# Patient Record
Sex: Male | Born: 1935 | Race: White | Hispanic: No | Smoking: Former smoker
Health system: Southern US, Community
[De-identification: ages and names within clinical notes are randomized; demographics above are authoritative.]

## PROBLEM LIST (undated history)

## (undated) DIAGNOSIS — C7A012 Malignant carcinoid tumor of the ileum: Secondary | ICD-10-CM

## (undated) DIAGNOSIS — K5090 Crohn's disease, unspecified, without complications: Secondary | ICD-10-CM

## (undated) DIAGNOSIS — F1011 Alcohol abuse, in remission: Secondary | ICD-10-CM

## (undated) DIAGNOSIS — K219 Gastro-esophageal reflux disease without esophagitis: Secondary | ICD-10-CM

## (undated) DIAGNOSIS — K222 Esophageal obstruction: Secondary | ICD-10-CM

## (undated) DIAGNOSIS — N200 Calculus of kidney: Secondary | ICD-10-CM

## (undated) DIAGNOSIS — C61 Malignant neoplasm of prostate: Secondary | ICD-10-CM

## (undated) HISTORY — DX: Malignant neoplasm of prostate: C61

## (undated) HISTORY — PX: PROSTATECTOMY: SHX69

## (undated) HISTORY — PX: APPENDECTOMY: SHX54

## (undated) HISTORY — DX: Alcohol abuse, in remission: F10.11

## (undated) HISTORY — DX: Calculus of kidney: N20.0

## (undated) HISTORY — DX: Crohn's disease, unspecified, without complications: K50.90

## (undated) HISTORY — DX: Esophageal obstruction: K22.2

## (undated) HISTORY — DX: Malignant carcinoid tumor of the ileum: C7A.012

## (undated) HISTORY — PX: LITHOTRIPSY: SUR834

## (undated) HISTORY — DX: Gastro-esophageal reflux disease without esophagitis: K21.9

## (undated) HISTORY — PX: TONSILLECTOMY: SUR1361

---

## 1999-04-18 ENCOUNTER — Ambulatory Visit (HOSPITAL_COMMUNITY): Admission: RE | Admit: 1999-04-18 | Discharge: 1999-04-18 | Payer: Self-pay | Admitting: Cardiology

## 1999-04-18 ENCOUNTER — Encounter: Payer: Self-pay | Admitting: Internal Medicine

## 2001-10-13 ENCOUNTER — Ambulatory Visit (HOSPITAL_COMMUNITY): Admission: RE | Admit: 2001-10-13 | Discharge: 2001-10-13 | Payer: Self-pay | Admitting: Gastroenterology

## 2001-10-13 ENCOUNTER — Encounter: Payer: Self-pay | Admitting: Gastroenterology

## 2001-10-24 ENCOUNTER — Encounter: Payer: Self-pay | Admitting: Internal Medicine

## 2001-12-15 ENCOUNTER — Ambulatory Visit (HOSPITAL_COMMUNITY): Admission: RE | Admit: 2001-12-15 | Discharge: 2001-12-15 | Payer: Self-pay | Admitting: Internal Medicine

## 2001-12-15 ENCOUNTER — Encounter: Payer: Self-pay | Admitting: Internal Medicine

## 2002-01-05 ENCOUNTER — Ambulatory Visit (HOSPITAL_COMMUNITY): Admission: RE | Admit: 2002-01-05 | Discharge: 2002-01-05 | Payer: Self-pay | Admitting: Internal Medicine

## 2002-01-16 ENCOUNTER — Inpatient Hospital Stay (HOSPITAL_COMMUNITY): Admission: EM | Admit: 2002-01-16 | Discharge: 2002-01-19 | Payer: Self-pay | Admitting: Emergency Medicine

## 2002-01-16 ENCOUNTER — Encounter: Payer: Self-pay | Admitting: Emergency Medicine

## 2002-01-18 ENCOUNTER — Encounter: Payer: Self-pay | Admitting: Surgery

## 2002-05-12 ENCOUNTER — Encounter: Payer: Self-pay | Admitting: Surgery

## 2002-05-19 ENCOUNTER — Inpatient Hospital Stay (HOSPITAL_COMMUNITY): Admission: RE | Admit: 2002-05-19 | Discharge: 2002-05-24 | Payer: Self-pay | Admitting: Surgery

## 2002-05-19 ENCOUNTER — Encounter (INDEPENDENT_AMBULATORY_CARE_PROVIDER_SITE_OTHER): Payer: Self-pay | Admitting: Specialist

## 2002-05-19 HISTORY — PX: SMALL INTESTINE SURGERY: SHX150

## 2002-05-22 ENCOUNTER — Encounter: Payer: Self-pay | Admitting: Surgery

## 2002-08-07 ENCOUNTER — Encounter (HOSPITAL_COMMUNITY): Payer: Self-pay | Admitting: Oncology

## 2002-08-07 ENCOUNTER — Ambulatory Visit (HOSPITAL_COMMUNITY): Admission: RE | Admit: 2002-08-07 | Discharge: 2002-08-07 | Payer: Self-pay | Admitting: Oncology

## 2002-09-07 ENCOUNTER — Encounter (HOSPITAL_COMMUNITY): Payer: Self-pay | Admitting: Oncology

## 2002-09-07 ENCOUNTER — Ambulatory Visit (HOSPITAL_COMMUNITY): Admission: RE | Admit: 2002-09-07 | Discharge: 2002-09-07 | Payer: Self-pay | Admitting: Oncology

## 2003-01-29 ENCOUNTER — Encounter (HOSPITAL_COMMUNITY): Payer: Self-pay | Admitting: Oncology

## 2003-01-29 ENCOUNTER — Ambulatory Visit (HOSPITAL_COMMUNITY): Admission: RE | Admit: 2003-01-29 | Discharge: 2003-01-29 | Payer: Self-pay | Admitting: Oncology

## 2003-03-29 ENCOUNTER — Encounter (HOSPITAL_COMMUNITY): Payer: Self-pay | Admitting: Oncology

## 2003-03-29 ENCOUNTER — Ambulatory Visit (HOSPITAL_COMMUNITY): Admission: RE | Admit: 2003-03-29 | Discharge: 2003-03-29 | Payer: Self-pay | Admitting: Oncology

## 2004-06-07 ENCOUNTER — Ambulatory Visit: Payer: Self-pay | Admitting: Oncology

## 2004-07-24 IMAGING — CT CT ABDOMEN W/ CM
1 of 3 series · 14 of 32 positions shown, 19 images · IV contrast (omnipaque)
Comparison: none

FINDINGS
CLINICAL DATA: 69 YEAR OLD WITH CARCINOID TUMOR.
CT ABDOMEN AND PELVIS WITH CONTRAST
HELICAL IMAGES ARE PERFORMED THROUGH THE ABDOMEN AND PELVIS FOLLOWING ADMINISTRATION OF ORAL
CONTRAST AND DURING ADMINISTRATION OF 150 CC OMNIPAQUE 300.  IMAGES OF THE LUNG BASES ARE
UNREMARKABLE.  NO FOCAL ABNORMALITY IS SEEN WITHIN THE LIVER, SPLEEN, PANCREAS, OR ADRENAL GLANDS.
THERE ARE NON ENLARGED PERIPORTAL LYMPH NODES STABLE IN APPEARANCE.  SMALL LOW ATTENUATION LESIONS
WITHIN THE KIDNEYS ARE CONSISTENT WITH CYSTS THAT ARE UNCHANGED.  GALLBLADDER IS PRESENT.
DEGENERATIVE CHANGES ARE NOTED IN THE SPINE.
IMPRESSION
NO EVIDENCE FOR METASTATIC DISEASE OF THE ABDOMEN.
CT OF THE PELVIS WITH CONTRAST
PROSTATE IS ENLARGED.  THERE IS NO PELVIC ADENOPATHY OR FREE PELVIC FLUID.  PELVIC BOWEL LOOPS HAVE
A NORMAL APPEARANCE.
NO EVIDENCE FOR RECURRENT DISEASE OF THE PELVIS.

[Series 2: — · axial · 0.69mm/px · z∈[+717,+1075]mm · 14 of 80 slices shown, 19 images]
[im 4/80  soft-tissue]
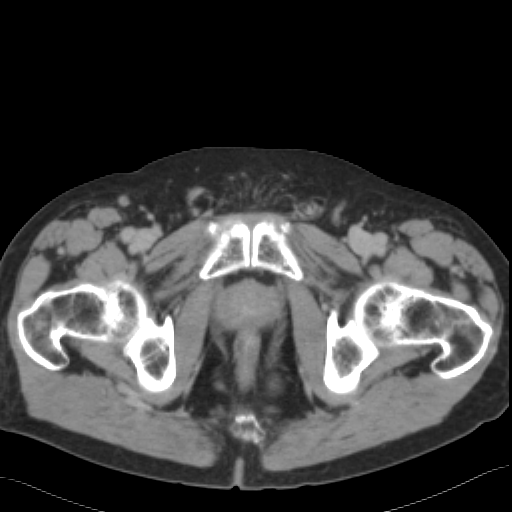
[im 4/80  bone]
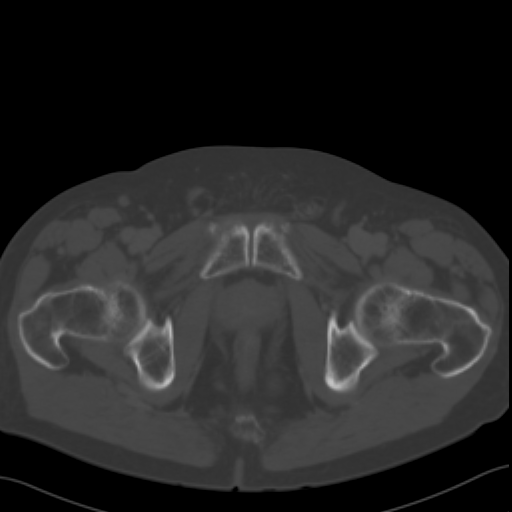
[im 12/80  soft-tissue]
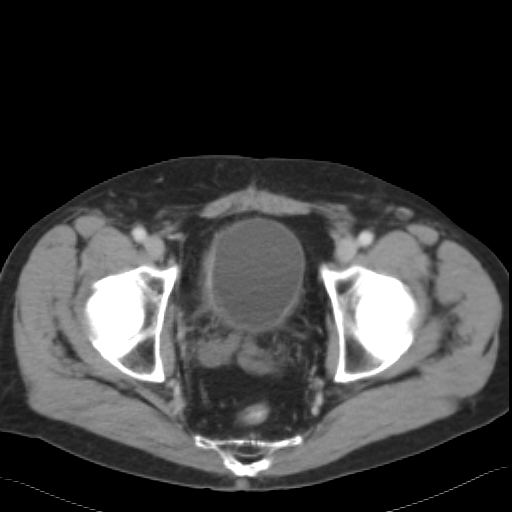
[im 16/80  soft-tissue]
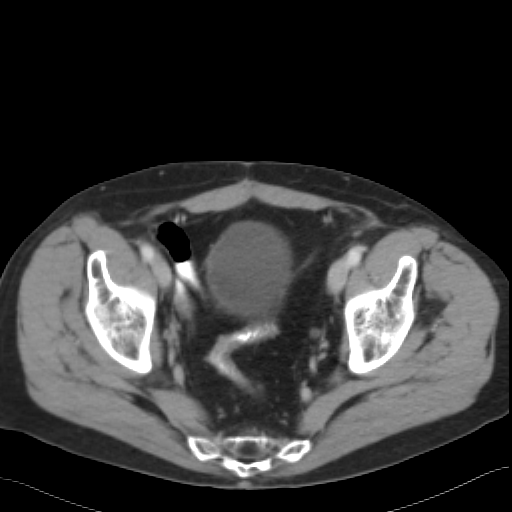
[im 24/80  soft-tissue]
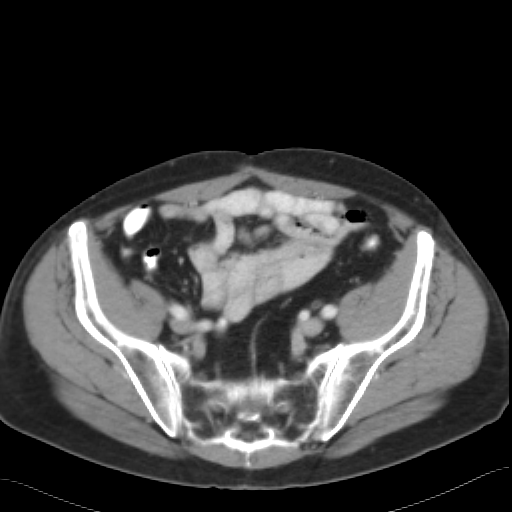
[im 28/80  soft-tissue]
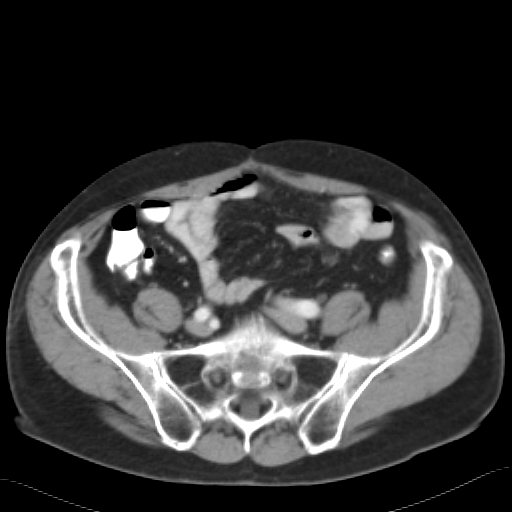
[im 36/80  soft-tissue]
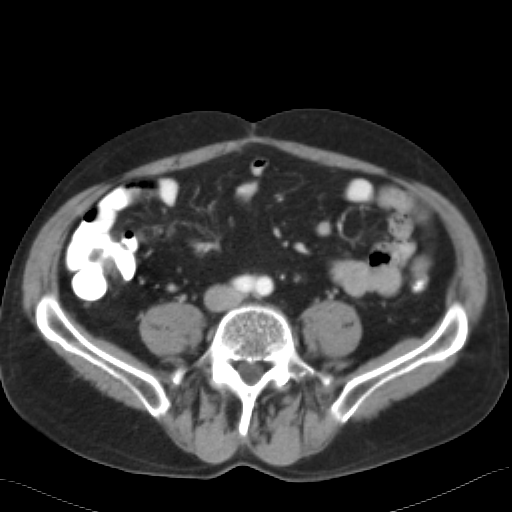
[im 40/80  soft-tissue]
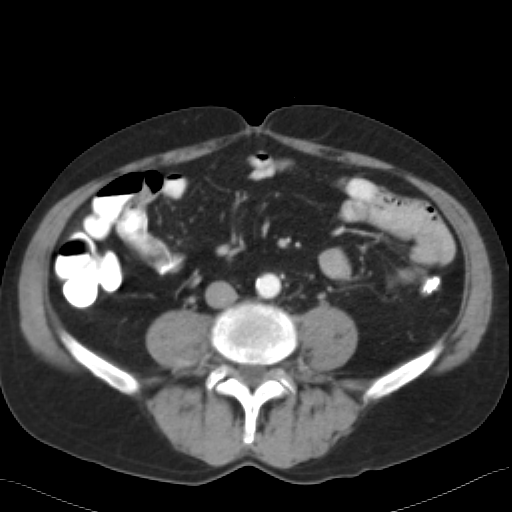
[im 44/80  soft-tissue]
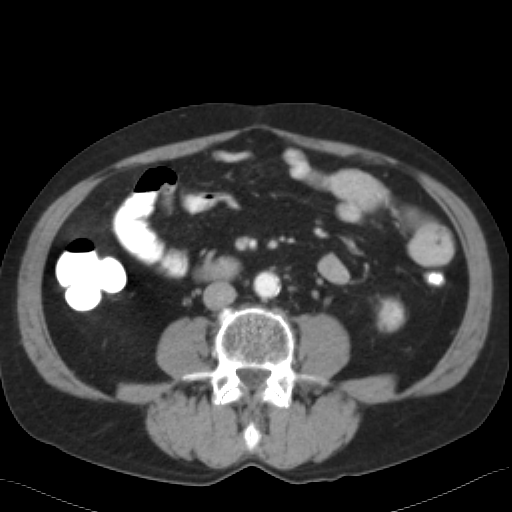
[im 52/80  soft-tissue]
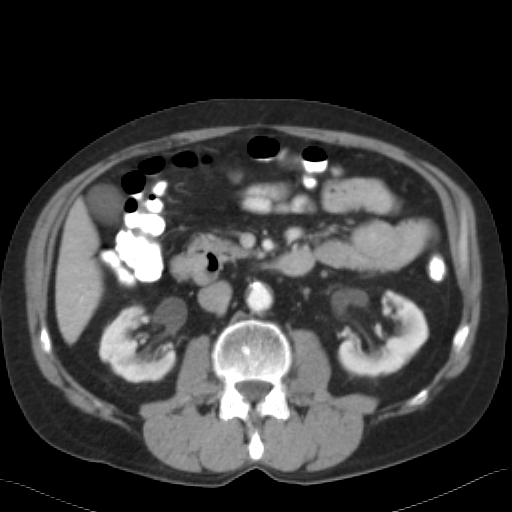
[im 52/80  bone]
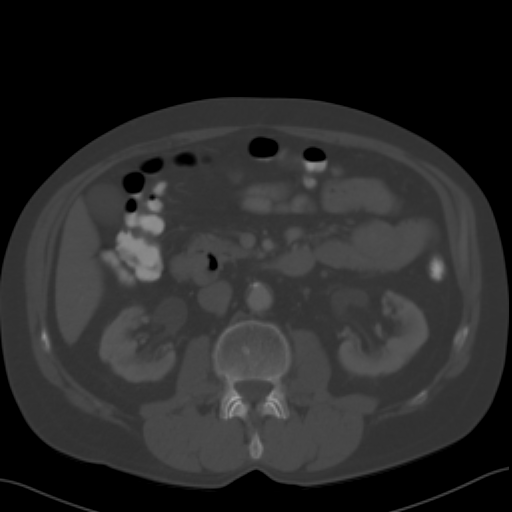
[im 56/80  soft-tissue]
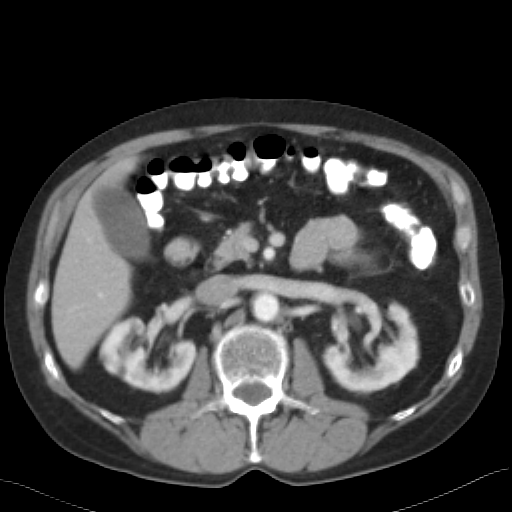
[im 64/80  soft-tissue]
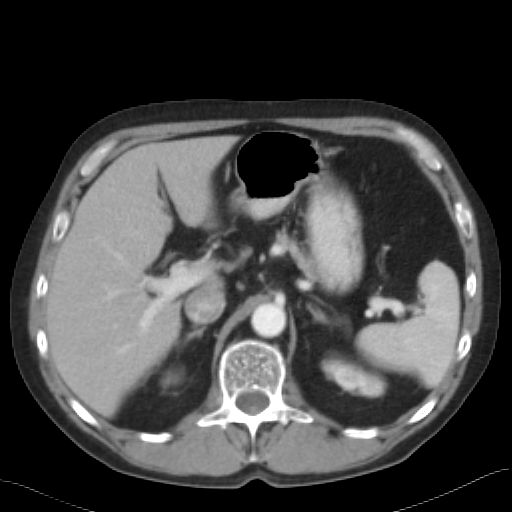
[im 64/80  lung]
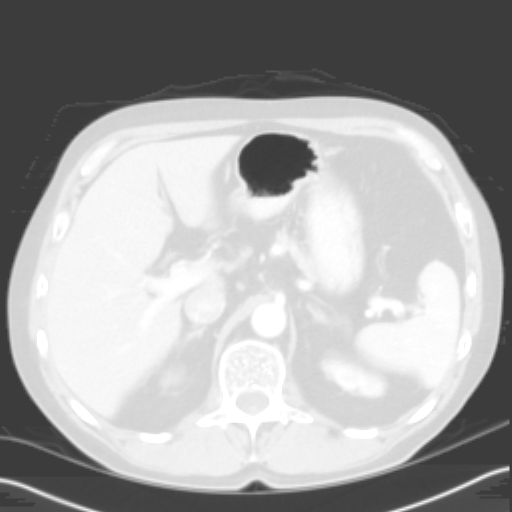
[im 68/80  soft-tissue]
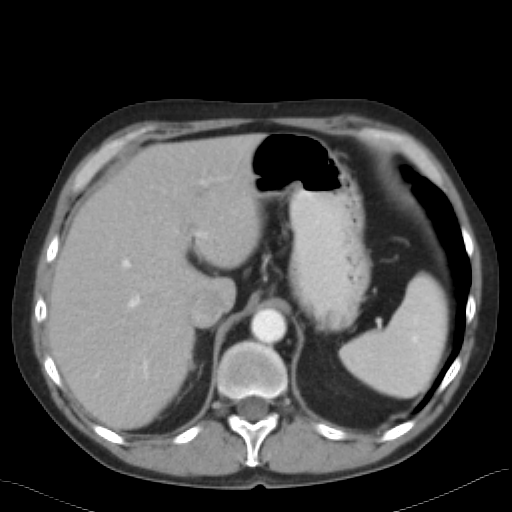
[im 68/80  lung]
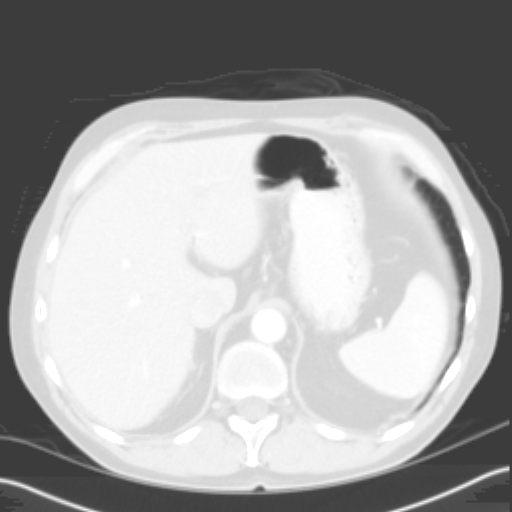
[im 72/80  lung]
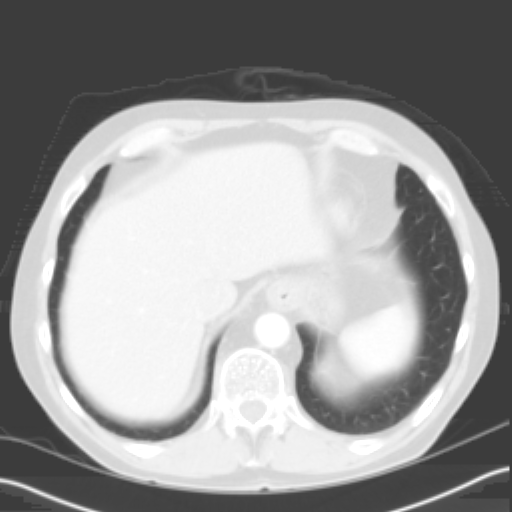
[im 76/80  soft-tissue]
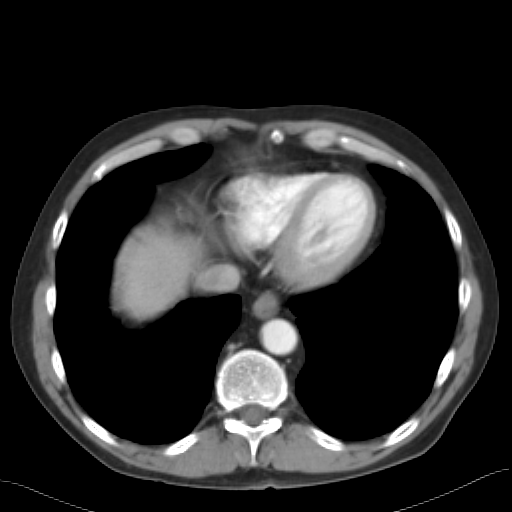
[im 76/80  lung]
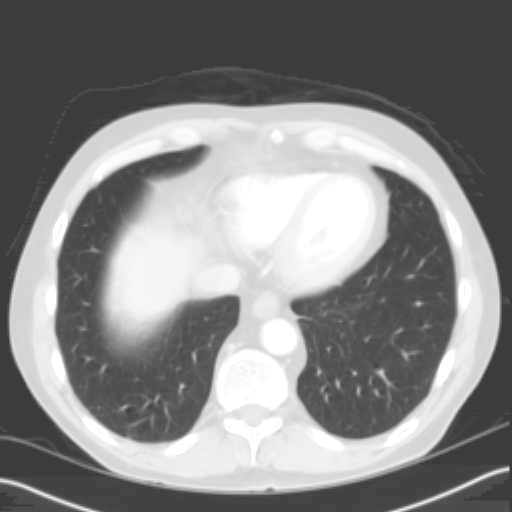

[14 of 32 positions shown; findings below may reference images not displayed]

## 2004-10-13 ENCOUNTER — Ambulatory Visit: Payer: Self-pay | Admitting: Oncology

## 2005-03-14 ENCOUNTER — Ambulatory Visit: Payer: Self-pay | Admitting: Internal Medicine

## 2005-04-13 ENCOUNTER — Ambulatory Visit (HOSPITAL_COMMUNITY): Admission: RE | Admit: 2005-04-13 | Discharge: 2005-04-13 | Payer: Self-pay | Admitting: Oncology

## 2005-04-19 ENCOUNTER — Ambulatory Visit: Payer: Self-pay | Admitting: Oncology

## 2005-10-17 ENCOUNTER — Ambulatory Visit: Payer: Self-pay | Admitting: Oncology

## 2005-10-19 LAB — CBC WITH DIFFERENTIAL/PLATELET
Basophils Absolute: 0 10*3/uL (ref 0.0–0.1)
EOS%: 1.2 % (ref 0.0–7.0)
Eosinophils Absolute: 0.1 10*3/uL (ref 0.0–0.5)
HCT: 36.2 % — ABNORMAL LOW (ref 38.7–49.9)
HGB: 12.7 g/dL — ABNORMAL LOW (ref 13.0–17.1)
MCH: 35 pg — ABNORMAL HIGH (ref 28.0–33.4)
MCV: 99.5 fL — ABNORMAL HIGH (ref 81.6–98.0)
MONO%: 5.5 % (ref 0.0–13.0)
NEUT#: 3.2 10*3/uL (ref 1.5–6.5)
NEUT%: 62.7 % (ref 40.0–75.0)
Platelets: 206 10*3/uL (ref 145–400)
RDW: 12.7 % (ref 11.2–14.6)

## 2005-10-19 LAB — COMPREHENSIVE METABOLIC PANEL
AST: 20 U/L (ref 0–37)
Albumin: 4.5 g/dL (ref 3.5–5.2)
Alkaline Phosphatase: 45 U/L (ref 39–117)
BUN: 26 mg/dL — ABNORMAL HIGH (ref 6–23)
Calcium: 9 mg/dL (ref 8.4–10.5)
Creatinine, Ser: 2.1 mg/dL — ABNORMAL HIGH (ref 0.4–1.5)
Glucose, Bld: 85 mg/dL (ref 70–99)
Potassium: 4.1 mEq/L (ref 3.5–5.3)

## 2005-10-19 LAB — PSA: PSA: 0.03 ng/mL — ABNORMAL LOW (ref 0.10–4.00)

## 2005-10-23 LAB — CREATININE CLEARANCE, URINE, 24 HOUR
Creatinine: 2.1 mg/dL — ABNORMAL HIGH (ref 0.4–1.5)
Urine Total Volume-CRCL: 950 mL

## 2005-10-26 LAB — 5 HIAA, QUANTITATIVE, URINE, 24 HOUR
5-HIAA, Ur: 3.7 mg/L
Creatinine, Urine mg/day: 959 mg/d (ref 800–2100)
Creatinine, Urine-mg/dL-5HIAA: 101 mg/dL
Time-5HIAA: 24 hr
Volume, Urine-5HIAA: 950 mL

## 2006-04-16 ENCOUNTER — Ambulatory Visit: Payer: Self-pay | Admitting: Oncology

## 2006-10-15 ENCOUNTER — Ambulatory Visit: Payer: Self-pay | Admitting: Oncology

## 2006-10-18 LAB — COMPREHENSIVE METABOLIC PANEL
ALT: 13 U/L (ref 0–53)
AST: 13 U/L (ref 0–37)
Albumin: 4.3 g/dL (ref 3.5–5.2)
CO2: 24 mEq/L (ref 19–32)
Calcium: 9.3 mg/dL (ref 8.4–10.5)
Chloride: 103 mEq/L (ref 96–112)
Creatinine, Ser: 1.5 mg/dL (ref 0.40–1.50)
Potassium: 4.3 mEq/L (ref 3.5–5.3)
Sodium: 139 mEq/L (ref 135–145)
Total Protein: 7 g/dL (ref 6.0–8.3)

## 2006-10-18 LAB — CBC WITH DIFFERENTIAL/PLATELET
BASO%: 1.4 % (ref 0.0–2.0)
EOS%: 0.9 % (ref 0.0–7.0)
HCT: 35.7 % — ABNORMAL LOW (ref 38.7–49.9)
MCH: 35.4 pg — ABNORMAL HIGH (ref 28.0–33.4)
MCHC: 36.3 g/dL — ABNORMAL HIGH (ref 32.0–35.9)
MONO#: 0.3 10*3/uL (ref 0.1–0.9)
NEUT%: 65.4 % (ref 40.0–75.0)
RDW: 11.3 % (ref 11.2–14.6)
WBC: 5.2 10*3/uL (ref 4.0–10.0)
lymph#: 1.4 10*3/uL (ref 0.9–3.3)

## 2006-10-18 LAB — LACTATE DEHYDROGENASE: LDH: 115 U/L (ref 94–250)

## 2006-10-21 ENCOUNTER — Ambulatory Visit (HOSPITAL_COMMUNITY): Admission: RE | Admit: 2006-10-21 | Discharge: 2006-10-21 | Payer: Self-pay | Admitting: Oncology

## 2006-12-13 ENCOUNTER — Ambulatory Visit: Payer: Self-pay | Admitting: Internal Medicine

## 2007-04-15 ENCOUNTER — Ambulatory Visit: Payer: Self-pay | Admitting: Oncology

## 2007-04-18 LAB — CBC WITH DIFFERENTIAL/PLATELET
EOS%: 0.4 % (ref 0.0–7.0)
Eosinophils Absolute: 0 10*3/uL (ref 0.0–0.5)
LYMPH%: 20.2 % (ref 14.0–48.0)
MCH: 35.5 pg — ABNORMAL HIGH (ref 28.0–33.4)
MCV: 99.1 fL — ABNORMAL HIGH (ref 81.6–98.0)
MONO%: 4.9 % (ref 0.0–13.0)
NEUT#: 3.2 10*3/uL (ref 1.5–6.5)
Platelets: 197 10*3/uL (ref 145–400)
RBC: 3.45 10*6/uL — ABNORMAL LOW (ref 4.20–5.71)
RDW: 12.7 % (ref 11.2–14.6)

## 2007-04-18 LAB — COMPREHENSIVE METABOLIC PANEL
ALT: 17 U/L (ref 0–53)
Alkaline Phosphatase: 41 U/L (ref 39–117)
CO2: 25 mEq/L (ref 19–32)
Sodium: 142 mEq/L (ref 135–145)
Total Bilirubin: 0.6 mg/dL (ref 0.3–1.2)
Total Protein: 6.7 g/dL (ref 6.0–8.3)

## 2007-10-16 ENCOUNTER — Ambulatory Visit: Payer: Self-pay | Admitting: Oncology

## 2007-10-21 LAB — CBC WITH DIFFERENTIAL/PLATELET
BASO%: 1 % (ref 0.0–2.0)
EOS%: 2 % (ref 0.0–7.0)
HCT: 35.8 % — ABNORMAL LOW (ref 38.7–49.9)
MCH: 35.8 pg — ABNORMAL HIGH (ref 28.0–33.4)
MCHC: 36.2 g/dL — ABNORMAL HIGH (ref 32.0–35.9)
MONO#: 0.2 10*3/uL (ref 0.1–0.9)
RBC: 3.62 10*6/uL — ABNORMAL LOW (ref 4.20–5.71)
RDW: 12.6 % (ref 11.2–14.6)
WBC: 4.8 10*3/uL (ref 4.0–10.0)
lymph#: 1 10*3/uL (ref 0.9–3.3)

## 2007-10-21 LAB — COMPREHENSIVE METABOLIC PANEL
ALT: 20 U/L (ref 0–53)
AST: 17 U/L (ref 0–37)
CO2: 24 mEq/L (ref 19–32)
Calcium: 9.3 mg/dL (ref 8.4–10.5)
Chloride: 104 mEq/L (ref 96–112)
Potassium: 4.8 mEq/L (ref 3.5–5.3)
Sodium: 140 mEq/L (ref 135–145)
Total Protein: 6.9 g/dL (ref 6.0–8.3)

## 2007-10-21 LAB — LACTATE DEHYDROGENASE: LDH: 129 U/L (ref 94–250)

## 2007-11-17 ENCOUNTER — Ambulatory Visit: Payer: Self-pay | Admitting: Internal Medicine

## 2007-11-25 ENCOUNTER — Encounter: Payer: Self-pay | Admitting: Internal Medicine

## 2007-11-27 ENCOUNTER — Encounter: Payer: Self-pay | Admitting: Internal Medicine

## 2007-11-27 ENCOUNTER — Ambulatory Visit: Payer: Self-pay | Admitting: Internal Medicine

## 2007-11-28 ENCOUNTER — Encounter: Payer: Self-pay | Admitting: Internal Medicine

## 2007-12-15 ENCOUNTER — Telehealth: Payer: Self-pay | Admitting: Internal Medicine

## 2007-12-22 ENCOUNTER — Encounter: Payer: Self-pay | Admitting: Internal Medicine

## 2008-04-15 ENCOUNTER — Ambulatory Visit: Payer: Self-pay | Admitting: Oncology

## 2008-04-15 IMAGING — CR DG CHEST 2V
2 series · 2 of 2 positions shown · non-contrast
Comparison: 04/13/05.

CLINICAL DATA: Sore throat with chest congestion.  Follow-up, history of prostate cancer and colon cancer. 
 CHEST - 2 VIEW:

[w chest pa]
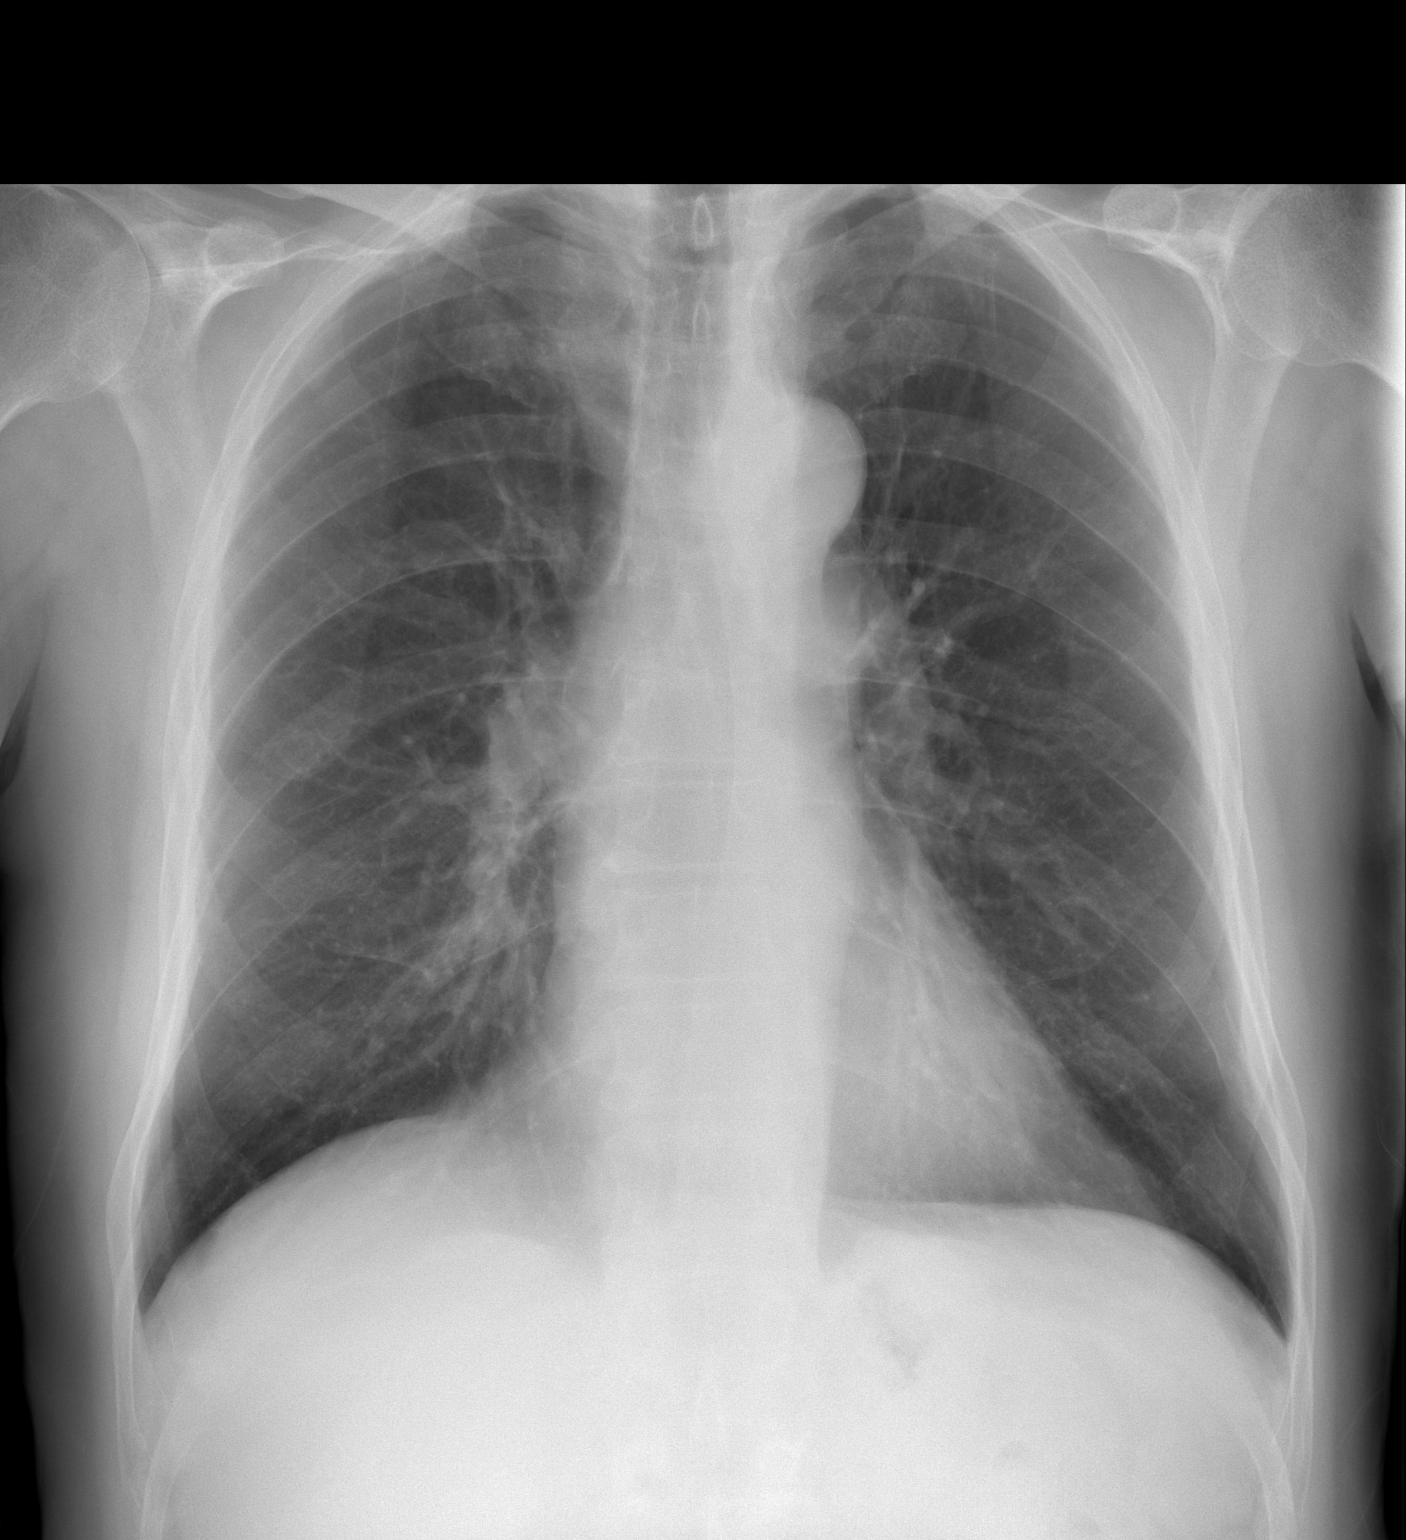

[w chest lat]
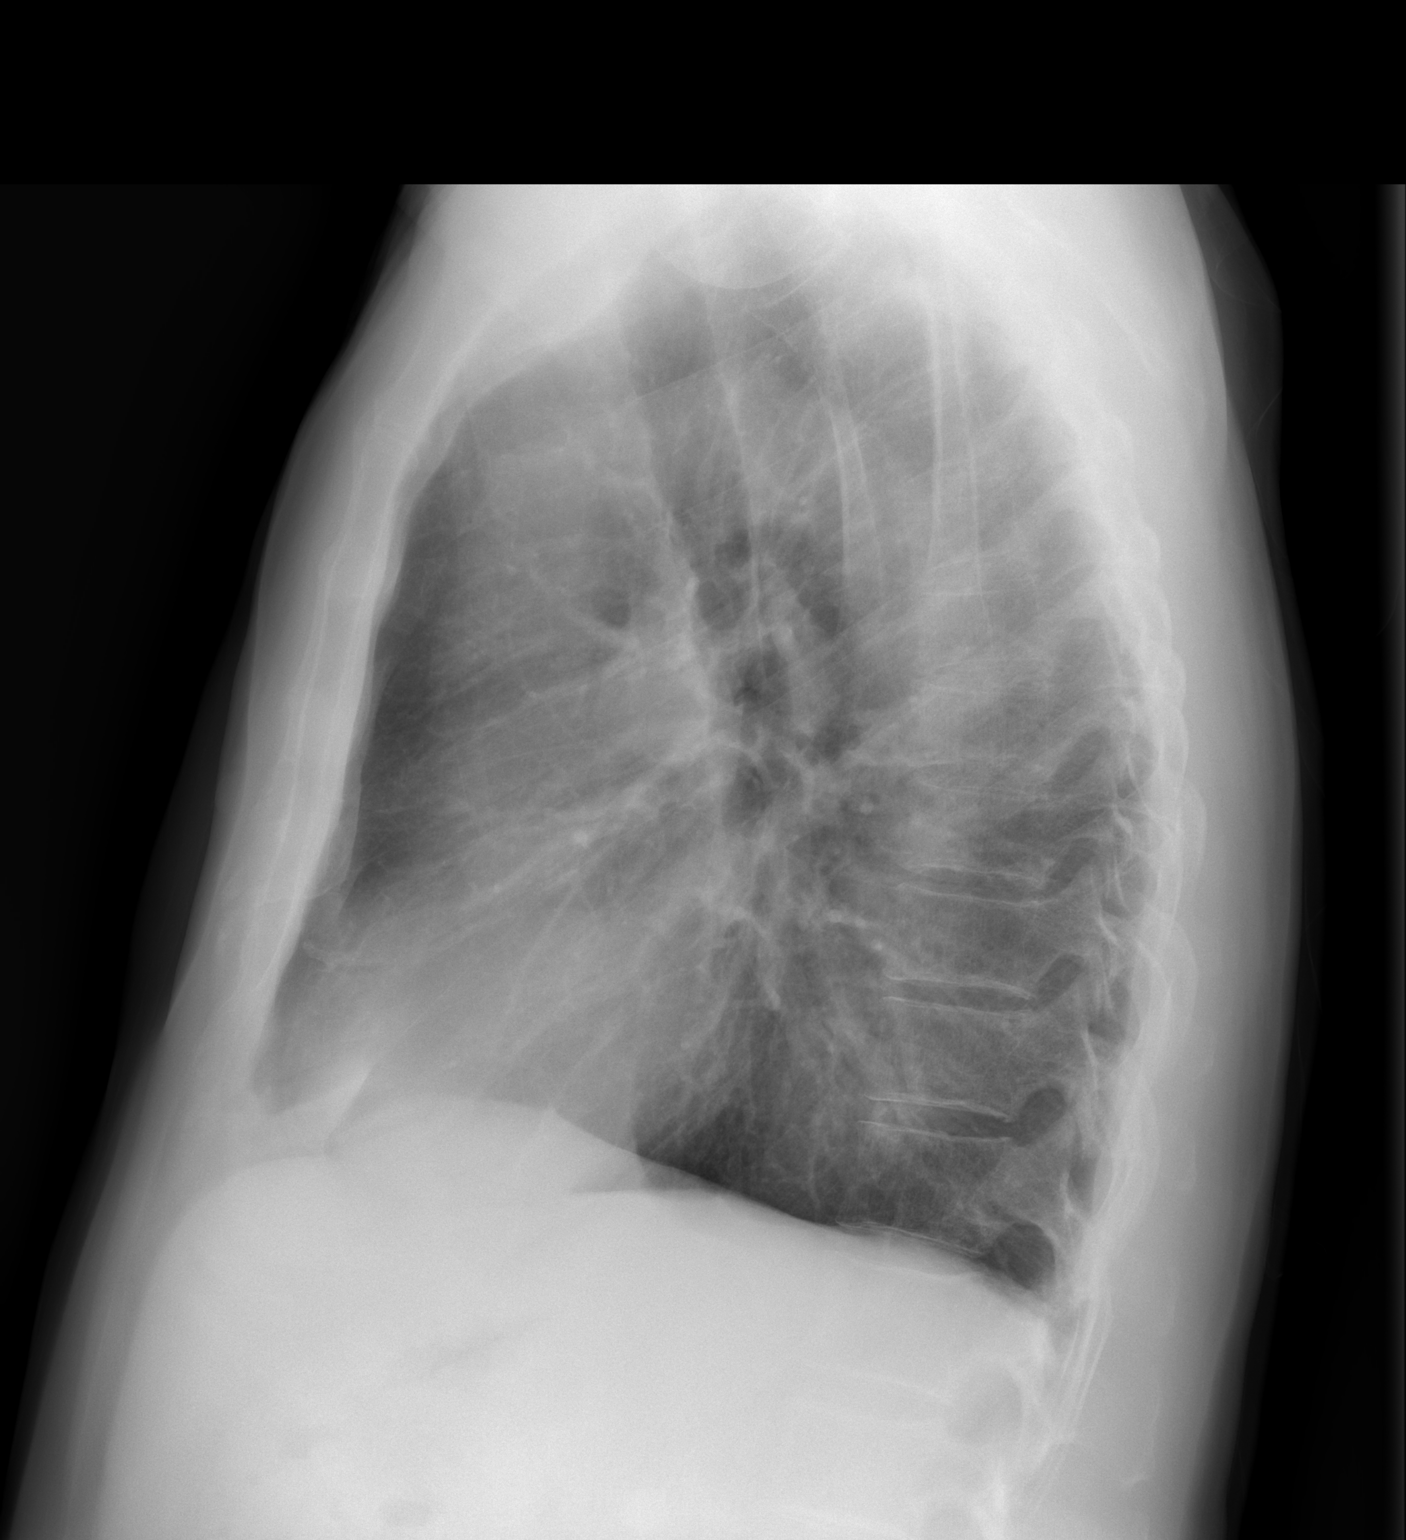

[2 of 2 positions shown; findings below may reference images not displayed]

FINDINGS: The trachea is midline.  The heart size is normal.   The transverse aorta is mildly prominent, stable.   The mediastinal contours are otherwise normal.  The costophrenic angles are sharp.  No pneumothorax.   The lungs are clear.
IMPRESSION: No acute cardiopulmonary disease.

## 2008-04-19 ENCOUNTER — Encounter: Payer: Self-pay | Admitting: Internal Medicine

## 2008-04-19 ENCOUNTER — Ambulatory Visit (HOSPITAL_COMMUNITY): Admission: RE | Admit: 2008-04-19 | Discharge: 2008-04-19 | Payer: Self-pay | Admitting: Oncology

## 2008-04-19 LAB — CBC WITH DIFFERENTIAL/PLATELET
BASO%: 0.7 % (ref 0.0–2.0)
EOS%: 1.3 % (ref 0.0–7.0)
HCT: 39.2 % (ref 38.7–49.9)
MCH: 35.5 pg — ABNORMAL HIGH (ref 28.0–33.4)
MCHC: 34.7 g/dL (ref 32.0–35.9)
NEUT%: 73.2 % (ref 40.0–75.0)
RDW: 12.4 % (ref 11.2–14.6)
lymph#: 1.2 10*3/uL (ref 0.9–3.3)

## 2008-04-19 LAB — PSA: PSA: 0.21 ng/mL (ref 0.10–4.00)

## 2008-04-19 LAB — COMPREHENSIVE METABOLIC PANEL
ALT: 50 U/L (ref 0–53)
AST: 33 U/L (ref 0–37)
Alkaline Phosphatase: 55 U/L (ref 39–117)
Calcium: 9.3 mg/dL (ref 8.4–10.5)
Chloride: 105 mEq/L (ref 96–112)
Creatinine, Ser: 1.54 mg/dL — ABNORMAL HIGH (ref 0.40–1.50)

## 2008-04-19 LAB — LACTATE DEHYDROGENASE: LDH: 145 U/L (ref 94–250)

## 2008-08-17 ENCOUNTER — Telehealth: Payer: Self-pay | Admitting: Internal Medicine

## 2008-08-25 DIAGNOSIS — Z8719 Personal history of other diseases of the digestive system: Secondary | ICD-10-CM | POA: Insufficient documentation

## 2008-08-25 DIAGNOSIS — K219 Gastro-esophageal reflux disease without esophagitis: Secondary | ICD-10-CM

## 2008-08-25 DIAGNOSIS — K222 Esophageal obstruction: Secondary | ICD-10-CM

## 2008-08-25 DIAGNOSIS — K5090 Crohn's disease, unspecified, without complications: Secondary | ICD-10-CM | POA: Insufficient documentation

## 2008-08-25 DIAGNOSIS — Z8546 Personal history of malignant neoplasm of prostate: Secondary | ICD-10-CM

## 2008-08-25 DIAGNOSIS — F1021 Alcohol dependence, in remission: Secondary | ICD-10-CM

## 2008-08-25 DIAGNOSIS — C7A012 Malignant carcinoid tumor of the ileum: Secondary | ICD-10-CM

## 2008-08-30 ENCOUNTER — Ambulatory Visit: Payer: Self-pay | Admitting: Internal Medicine

## 2008-09-07 ENCOUNTER — Telehealth: Payer: Self-pay | Admitting: Internal Medicine

## 2008-09-08 ENCOUNTER — Encounter: Payer: Self-pay | Admitting: Internal Medicine

## 2008-09-22 ENCOUNTER — Telehealth: Payer: Self-pay | Admitting: Internal Medicine

## 2008-10-14 ENCOUNTER — Encounter: Payer: Self-pay | Admitting: Internal Medicine

## 2008-10-18 ENCOUNTER — Telehealth: Payer: Self-pay | Admitting: Internal Medicine

## 2008-10-20 ENCOUNTER — Ambulatory Visit: Payer: Self-pay | Admitting: Oncology

## 2008-10-22 ENCOUNTER — Encounter: Payer: Self-pay | Admitting: Internal Medicine

## 2008-10-22 LAB — PSA: PSA: 0.01 ng/mL — ABNORMAL LOW (ref 0.10–4.00)

## 2008-10-22 LAB — COMPREHENSIVE METABOLIC PANEL
Albumin: 4.3 g/dL (ref 3.5–5.2)
CO2: 20 mEq/L (ref 19–32)
Chloride: 106 mEq/L (ref 96–112)
Glucose, Bld: 91 mg/dL (ref 70–99)
Potassium: 4.4 mEq/L (ref 3.5–5.3)
Sodium: 138 mEq/L (ref 135–145)
Total Protein: 6.8 g/dL (ref 6.0–8.3)

## 2008-10-22 LAB — CBC WITH DIFFERENTIAL/PLATELET
Basophils Absolute: 0 10*3/uL (ref 0.0–0.1)
Eosinophils Absolute: 0.1 10*3/uL (ref 0.0–0.5)
HGB: 12.2 g/dL — ABNORMAL LOW (ref 13.0–17.1)
MCV: 101.9 fL — ABNORMAL HIGH (ref 79.3–98.0)
MONO#: 0.3 10*3/uL (ref 0.1–0.9)
MONO%: 5.8 % (ref 0.0–14.0)
NEUT#: 3.1 10*3/uL (ref 1.5–6.5)
Platelets: 208 10*3/uL (ref 140–400)
RDW: 12.2 % (ref 11.0–14.6)
WBC: 4.5 10*3/uL (ref 4.0–10.3)

## 2008-10-22 LAB — LACTATE DEHYDROGENASE: LDH: 160 U/L (ref 94–250)

## 2008-11-09 ENCOUNTER — Ambulatory Visit: Payer: Self-pay | Admitting: Internal Medicine

## 2008-11-10 LAB — CONVERTED CEMR LAB
Albumin: 3.8 g/dL (ref 3.5–5.2)
BUN: 16 mg/dL (ref 6–23)
Chloride: 107 meq/L (ref 96–112)
Creatinine, Ser: 1.5 mg/dL (ref 0.4–1.5)
Glucose, Bld: 94 mg/dL (ref 70–99)
Phosphorus: 2.4 mg/dL (ref 2.3–4.6)

## 2009-04-21 ENCOUNTER — Ambulatory Visit: Payer: Self-pay | Admitting: Oncology

## 2009-04-25 LAB — COMPREHENSIVE METABOLIC PANEL
ALT: 36 U/L (ref 0–53)
Alkaline Phosphatase: 49 U/L (ref 39–117)
Sodium: 138 mEq/L (ref 135–145)
Total Bilirubin: 0.5 mg/dL (ref 0.3–1.2)
Total Protein: 6.9 g/dL (ref 6.0–8.3)

## 2009-04-25 LAB — CBC WITH DIFFERENTIAL/PLATELET
BASO%: 1 % (ref 0.0–2.0)
LYMPH%: 24.9 % (ref 14.0–49.0)
MCH: 36.2 pg — ABNORMAL HIGH (ref 27.2–33.4)
MCHC: 35.4 g/dL (ref 32.0–36.0)
MCV: 102.2 fL — ABNORMAL HIGH (ref 79.3–98.0)
MONO%: 3.9 % (ref 0.0–14.0)
Platelets: 180 10*3/uL (ref 140–400)
RBC: 3.53 10*6/uL — ABNORMAL LOW (ref 4.20–5.82)

## 2009-04-25 LAB — PSA: PSA: 0.11 ng/mL (ref 0.10–4.00)

## 2009-04-26 ENCOUNTER — Encounter: Payer: Self-pay | Admitting: Internal Medicine

## 2009-05-13 ENCOUNTER — Encounter: Payer: Self-pay | Admitting: Internal Medicine

## 2009-09-19 ENCOUNTER — Encounter: Payer: Self-pay | Admitting: Internal Medicine

## 2009-10-21 ENCOUNTER — Ambulatory Visit: Payer: Self-pay | Admitting: Oncology

## 2009-10-25 ENCOUNTER — Encounter: Payer: Self-pay | Admitting: Internal Medicine

## 2009-10-25 LAB — CBC WITH DIFFERENTIAL/PLATELET
Basophils Absolute: 0.1 10*3/uL (ref 0.0–0.1)
Eosinophils Absolute: 0.1 10*3/uL (ref 0.0–0.5)
HCT: 35.1 % — ABNORMAL LOW (ref 38.4–49.9)
LYMPH%: 28 % (ref 14.0–49.0)
MONO#: 0.3 10*3/uL (ref 0.1–0.9)
NEUT#: 3 10*3/uL (ref 1.5–6.5)
NEUT%: 62.3 % (ref 39.0–75.0)
Platelets: 219 10*3/uL (ref 140–400)
WBC: 4.8 10*3/uL (ref 4.0–10.3)

## 2009-10-25 LAB — COMPREHENSIVE METABOLIC PANEL
BUN: 18 mg/dL (ref 6–23)
CO2: 22 mEq/L (ref 19–32)
Creatinine, Ser: 1.64 mg/dL — ABNORMAL HIGH (ref 0.40–1.50)
Glucose, Bld: 83 mg/dL (ref 70–99)
Total Bilirubin: 0.5 mg/dL (ref 0.3–1.2)
Total Protein: 6.8 g/dL (ref 6.0–8.3)

## 2009-10-25 LAB — PSA: PSA: 0.26 ng/mL (ref 0.10–4.00)

## 2009-10-25 LAB — LACTATE DEHYDROGENASE: LDH: 135 U/L (ref 94–250)

## 2010-01-17 ENCOUNTER — Ambulatory Visit: Payer: Self-pay | Admitting: Internal Medicine

## 2010-04-25 ENCOUNTER — Ambulatory Visit: Payer: Self-pay | Admitting: Oncology

## 2010-04-27 ENCOUNTER — Ambulatory Visit (HOSPITAL_COMMUNITY)
Admission: RE | Admit: 2010-04-27 | Discharge: 2010-04-27 | Payer: Self-pay | Source: Home / Self Care | Admitting: Oncology

## 2010-04-27 ENCOUNTER — Encounter: Payer: Self-pay | Admitting: Internal Medicine

## 2010-04-27 LAB — CBC WITH DIFFERENTIAL/PLATELET
BASO%: 0.7 % (ref 0.0–2.0)
EOS%: 1.4 % (ref 0.0–7.0)
HCT: 36.3 % — ABNORMAL LOW (ref 38.4–49.9)
LYMPH%: 24.6 % (ref 14.0–49.0)
MCH: 36.1 pg — ABNORMAL HIGH (ref 27.2–33.4)
MCHC: 34.8 g/dL (ref 32.0–36.0)
NEUT%: 67.6 % (ref 39.0–75.0)
RBC: 3.5 10*6/uL — ABNORMAL LOW (ref 4.20–5.82)
WBC: 4.5 10*3/uL (ref 4.0–10.3)
lymph#: 1.1 10*3/uL (ref 0.9–3.3)

## 2010-04-28 LAB — COMPREHENSIVE METABOLIC PANEL
ALT: 25 U/L (ref 0–53)
AST: 25 U/L (ref 0–37)
Chloride: 102 mEq/L (ref 96–112)
Creatinine, Ser: 1.42 mg/dL (ref 0.40–1.50)
Sodium: 140 mEq/L (ref 135–145)
Total Bilirubin: 0.5 mg/dL (ref 0.3–1.2)
Total Protein: 6.9 g/dL (ref 6.0–8.3)

## 2010-08-05 ENCOUNTER — Encounter (HOSPITAL_COMMUNITY): Payer: Self-pay | Admitting: Oncology

## 2010-08-15 NOTE — Miscellaneous (Signed)
Summary: Nexium Rx  Clinical Lists Changes  Medications: Changed medication from [place] 40 MG  CPDR (ESOMEPRAZOLE MAGNESIUM) Take 1 tablet by mouth once a day to NEXIUM 40 MG  CPDR (ESOMEPRAZOLE MAGNESIUM) Take 1 tablet by mouth once a day MUST HAVE OFFICE VISIT FOR FURTHER REFILLS! - Signed Rx of NEXIUM 40 MG  CPDR (ESOMEPRAZOLE MAGNESIUM) Take 1 tablet by mouth once a day MUST HAVE OFFICE VISIT FOR FURTHER REFILLS!;  #30 x 0;  Signed;  Entered by: Madlyn Frankel CMA (AAMA);  Authorized by: Lafayette Dragon MD;  Method used: Electronically to [facility]. [address] [place], [place], [place]  [id number], Ph: [phone], Fax: [phone]    Prescriptions: NEXIUM 40 MG  CPDR (ESOMEPRAZOLE MAGNESIUM) Take 1 tablet by mouth once a day MUST HAVE OFFICE VISIT FOR FURTHER REFILLS!  #30 x 0   Entered by:   Madlyn Frankel CMA (AAMA)   Authorized by:   Lafayette Dragon MD   Signed by:   [place] ([place]) on 01/17/2010   Method used:   Electronically to        [facility] (retail)       [phone]       [place], [place]  [id number]       Ph: [phone]       Fax: [phone]   RxID:   [id number]

## 2010-08-15 NOTE — Assessment & Plan Note (Signed)
Summary: f/u--ch.    History of Present Illness Visit Type: Follow-up Visit Primary GI MD: [name] MD Primary Provider: n/a Chief Complaint: No problem, med. refill of Nexium History of Present Illness:   This is a 77 year old white male with Crohn's disease of the terminal ileum with positive IBD markers. 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 He has a history of prostate cancer and is status post prostatectomy. Patient has a positive family history of colon cancer in a direct relative and is due for a repeat colonoscopy in May 2014. He has been followed by Dr.Murinson. His last office visit with Dr Ralene Ok was in April 2011. Patient has a history of an esophageal stricture and is status post dilatation in May 2005. His symptoms have been controlled on Nexium 40 mg daily. He currently denies dysphagia.   GI Review of Systems      Denies abdominal pain, acid reflux, belching, bloating, chest pain, dysphagia with liquids, dysphagia with solids, heartburn, loss of appetite, nausea, vomiting, vomiting blood, weight loss, and  weight gain.        Denies anal fissure, black tarry stools, change in bowel habit, constipation, diarrhea, diverticulosis, fecal incontinence, heme positive stool, hemorrhoids, irritable bowel syndrome, jaundice, light color stool, liver problems, rectal bleeding, and  rectal pain.    Current Medications (verified): 1)  Nexium 40 Mg  Cpdr (Esomeprazole Magnesium) .... Take 1 Tablet By Mouth Once A Day Must Have Office Visit For Further Refills! 2)  Apriso 0.375 Gm Xr24h-Cap (Mesalamine) .... Take 4 Tablets By Mouth Every Morning. Must Have Office Visit For Further Refills! 3)  Tylenol Arthritis Pain 650 Mg Cr-Tabs (Acetaminophen) .... As Needed 4)  Hemocyte-Plus  Tabs (B Complex-C-Min-Fe-Fa) .... Take 2 Tablets By Mouth Once Daily 5)  Lisinopril-Hydrochlorothiazide 20-12.5 Mg Tabs  (Lisinopril-Hydrochlorothiazide) .... Once Daily 6)  Labetalol Hcl 100 Mg Tabs (Labetalol Hcl) .... Take 1 Tablet By Mouth Two Times A Day 7)  Cvs Arthritis Pain Relief 650 Mg Cr-Tabs (Acetaminophen) .... Take Up 6 Tablets Daily 8)  Hematinic Plus Vit/minerals  Tabs (B Complex-C-Min-Fe-Fa) .... Two Times A Day  Allergies (verified): No Known Drug Allergies  Past History:  Past Medical History: Reviewed history from 08/25/2008 and no changes required. 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 Appendectomy Prostatectomy Lithotripsy Tonsillectomy small bowel resection 05/19/02  Family History: Reviewed history from 08/30/2008 and no changes required. Family History of Breast Cancer: Sister Family History of Colon Cancer: Father Family History of Heart Disease: Brother x 2, Sister Family History of Liver Disease/Cirrhosis: Brother (alcoholic cirrhosis) Family History of Diabetes: Sister  Social History: Reviewed history from 08/25/2008 and no changes required. Patient is a former smoker. -smoked 1/2 ppd x 20 years.  Stopped over 30 years ago Alcohol Use - no- 1/5 liquor and 12 ppd for years. Stopped drinking over 20 years ago.  Review of Systems       The patient complains of arthritis/joint pain and back pain.  The patient denies allergy/sinus, anemia, anxiety-new, blood in urine, breast changes/lumps, change in vision, confusion, cough, coughing up blood, depression-new, fainting, fatigue, fever, headaches-new, hearing problems, heart  murmur, heart rhythm changes, itching, menstrual pain, muscle pains/cramps, night sweats, nosebleeds,  pregnancy symptoms, shortness of breath, skin rash, sleeping problems, sore throat, swelling of feet/legs, swollen lymph glands, thirst - excessive , urination - excessive , urination changes/pain, urine leakage, vision changes, and voice change.         Pertinent positive and negative review of systems were noted in the above HPI. All other ROS was otherwise negative.   Vital Signs:  Patient profile:   76 year old male Height:      74 inches Weight:      190 pounds BMI:     24.48 Pulse rate:   68 / minute Pulse rhythm:   regular BP sitting:   152 / 96  (left arm) Cuff size:   regular  Vitals Entered By: June McMurray [place] Deborra Medina) (January 17, 2010 9:40 AM)  Physical Exam  General:  Well developed, well nourished, no acute distress. Eyes:  PERRLA, no icterus. Mouth:  No deformity or lesions, dentition normal. Neck:  Supple; no masses or thyromegaly. Lungs:  Clear throughout to auscultation. Heart:  Regular rate and rhythm; no murmurs, rubs,  or bruits. Abdomen:  soft nontender abdomen with normal active bowel sounds. No palpable mass. Liver edge at costal margin. Rectal:  black Hemoccult negative stool. Extremities:  No clubbing, cyanosis, edema or deformities noted. Skin:  Intact without significant lesions or rashes. Psych:  Alert and cooperative. Normal mood and affect.   Impression & Recommendations:  Problem # 1:  Family Hx of COLON CANCER (ICD-153.9) Patient will be due for a colonoscopy in May 2014.  Problem # 2:  ALCOHOL ABUSE, HX OF (ICD-V11.3) There is no excess alcohol use currently.  Problem # 3:  Hx of ESOPHAGEAL STRICTURE (ICD-530.3) Patient is on Nexium 40 mg daily. He currently has no symptoms of dysphagia.  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 Ralene Ok.  Problem # 5:  CROHN'S DISEASE (ICD-555.9) Patient denies diarrhea or abdominal pain. He is to  continue Apriso .375 mg, 4 by mouth once daily.  Patient Instructions: 1)  Please continue all medications as prescribed. 2)  We have sent refills for Nexium and Apriso to your pharmacy. 3)  Recall colonoscopy in May 2014. 4)  The medication list was reviewed and reconciled.  All changed / newly prescribed medications were explained.  A complete medication list was provided to the patient / caregiver. 5)  Copy sent to : Dr Ralene Ok Prescriptions: APRISO 0.375 GM XR24H-CAP (MESALAMINE) Take 4 tablets by mouth every morning.  #120 x 5   Entered by:   Madlyn Frankel CMA (AAMA)   Authorized by:   Lafayette Dragon MD   Signed by:   [place] ([place]) on 01/17/2010   Method used:   Electronically to        [facility] (retail)       [phone] [place], [place]  [id number]       Ph: [phone]       Fax: [phone]   RxID:   [id number] NEXIUM 40 MG  CPDR (ESOMEPRAZOLE MAGNESIUM) Take 1 tablet by mouth once a day  #30 x 5   Entered by:   Madlyn Frankel CMA ([place])   Authorized by:   [name]  [name] MD   Signed by:   Madlyn Frankel CMA (AAMA) on 01/17/2010   Method used:   Electronically to        [facility] (retail)       [address]       [place], [place]  [id number]       Ph: [phone]       Fax: [phone]   RxID:   [id number]

## 2010-08-15 NOTE — Procedures (Signed)
Summary: colonoscopy   Colonoscopy  Procedure date:  11/27/2007  Findings:      Location:  [facility].    Procedures Next Due Date:    Colonoscopy: 11/2017  Patient Name: Ronald [name], Ronald [name]. MRN:  Procedure Procedures: Colonoscopy CPT: [phone].    with biopsy. CPT: X8550940.  Personnel: Endoscopist: Tymier Lindholm L. [name], MD.  Exam Location: Exam performed in Outpatient Clinic. Outpatient  Patient Consent: Procedure, Alternatives, Risks and Benefits discussed, consent obtained, from patient. Consent was obtained by the RN.  Indications  Surveillance of: hx of TI obstructyion 2003 thought to be due to Crohn's disease, later found to be a carcinoid tumor of the distal ileum.  History  Current Medications: Patient is not currently taking Coumadin.  Pre-Exam Physical: Performed Nov 27, 2007. Cardio-pulmonary exam, Rectal exam, HEENT exam , Abdominal exam, Extremity exam, Neurological exam, Mental status exam WNL.  Comments: Pt. history reviewed/updated, physical exam performed prior to initiation of sedation?yes Exam Exam: Extent of exam reached: Cecum, extent intended: Cecum.  The cecum was identified by appendiceal orifice and IC valve. ASA Classification: II. Tolerance: good.  Monitoring: Pulse and BP monitoring, Oximetry used. Supplemental O2 given.  Colon Prep Used Miralax for colon prep. Prep results: good.  Sedation Meds: Patient assessed and found to be appropriate for moderate (conscious) sedation. Fentanyl 25 mcg. given IV. Versed 1 mg. given IV.  Findings - NORMAL EXAM: Cecum.  - DIAGNOSTIC TEST: from Cecum. Reason: r/o inflammatory changes of the cecum.  - NORMAL EXAM: Rectum.   Assessment Normal examination.  Comments: normal exam to thr cecum, no evidence of IBD Events  Unplanned Interventions: No intervention was required.  Unplanned Events: There were no complications. Plans Medication Plan: Await pathology.  Patient  Education: Patient given standard instructions for: Patient instructed to get routine colonoscopy every 10 years.  Disposition: After procedure patient sent to recovery. After recovery patient sent home.    cc.Ronald Murinson,MD   REPORT OF SURGICAL PATHOLOGY   Case #: [id number] Patient Name: [name], [name] Office Chart Number:  [id number]   MRN: [id number] Pathologist: Jaquelyn Bitter A. Rodney Cruise, MD DOB/Age  08-27-1933 (Age: 77)    Gender: M Date Taken:  11/27/2007 Date Received: 11/28/2007   FINAL DIAGNOSIS   ***MICROSCOPIC EXAMINATION AND DIAGNOSIS***   1.  STOMACH, POLYPS, BIOPSIES:   -  BENIGN FUNDIC GLAND POLYPS -  NO ADENOMATOUS CHANGE OR MALIGNANCY IDENTIFIED   2.  COLON, ILEOCECAL VALVE, BIOPSIES:   -  POLYPOID COLONIC MUCOSA WITH REACTIVE LYMPHOID AGGREGATES. -  NO ADENOMATOUS CHANGE OR MALIGNANCY IDENTIFIED.     COMMENT 1.  A Warthin-Starry stain is performed to determine the possibility of the presence of Helicobacter pylori. The Warthin-Starry stain is negative for organisms of Helicobacter pylori. The control(s) stained appropriately.   kv Date Reported:  12/01/2007     Louanne Belton. Rodney Cruise, MD *** Electronically Signed Out By EAA ***   Clinical information Dysphagia/stricture.   R/O dysphagia (jy)   specimen(s) obtained 1: Stomach, polyp(s) 2: Colon, biopsy, cecal valve   Gross Description 1.  Received in formalin are tan, soft tissue fragments that are submitted in toto.  Number:  3  Size:  0.2 to 0.4 cm One block    2.  Received in formalin are tan, soft tissue fragments that are submitted in toto.  Number:  4  Size:  0.1 to 0.2 cm  One block   (SW:jes, 12/01/07)    jes/    Dec 01, 2007 MRN: [id number]  [address] [place], [place]  [address]    Dear Ronald [name],  I am please to report to You that Your stomach and colon biopsies were satisfactory. Please follow the instructions You were given at the time of Your exams last week.    This report was created from the original endoscopy report, which was reviewed and signed by the above listed endoscopist.

## 2010-08-15 NOTE — Miscellaneous (Signed)
Summary: Apriso Rx  Clinical Lists Changes  Medications: Changed medication from APRISO 0.375 GM XR24H-CAP (MESALAMINE) Take 4 tablets by mouth every morning. to APRISO 0.375 GM XR24H-CAP (MESALAMINE) Take 4 tablets by mouth every morning. MUST HAVE OFFICE VISIT FOR FURTHER REFILLS! - Signed Rx of APRISO 0.375 GM XR24H-CAP (MESALAMINE) Take 4 tablets by mouth every morning. MUST HAVE OFFICE VISIT FOR FURTHER REFILLS!;  #120 x 0;  Signed;  Entered by: Awilda Bill CMA (AAMA);  Authorized by: Lafayette Dragon MD;  Method used: Electronically to [facility]. [address], [place], [place], [place]  [id number], Ph: [phone], Fax: [phone]    Prescriptions: APRISO 0.375 GM XR24H-CAP (MESALAMINE) Take 4 tablets by mouth every morning. MUST HAVE OFFICE VISIT FOR FURTHER REFILLS!  #120 x 0   Entered by:   Awilda Bill CMA ([place])   Authorized by:   Lafayette Dragon MD   Signed by:   Awilda Bill CMA ([place]) on 09/19/2009   Method used:   Electronically to        [facility] (retail)       [phone]       [place], [place]  [id number]       Ph: [phone]       Fax: [phone]   RxID:   [id number]

## 2010-08-15 NOTE — Letter (Signed)
Summary: Ronald [name]   Imported By: Phillis Knack 11/15/2009 14:14:40  _____________________________________________________________________  External Attachment:    Type:   Image     Comment:   External Document

## 2010-08-15 NOTE — Letter (Signed)
Summary: [facility]   Imported By: Phillis Knack 05/18/2010 07:59:19  _____________________________________________________________________  External Attachment:    Type:   Image     Comment:   External Document

## 2010-10-26 ENCOUNTER — Other Ambulatory Visit (HOSPITAL_COMMUNITY): Payer: Self-pay | Admitting: Oncology

## 2010-10-26 ENCOUNTER — Encounter (HOSPITAL_BASED_OUTPATIENT_CLINIC_OR_DEPARTMENT_OTHER): Payer: Medicare Other | Admitting: Oncology

## 2010-10-26 DIAGNOSIS — E785 Hyperlipidemia, unspecified: Secondary | ICD-10-CM

## 2010-10-26 DIAGNOSIS — Z8546 Personal history of malignant neoplasm of prostate: Secondary | ICD-10-CM

## 2010-10-26 DIAGNOSIS — C172 Malignant neoplasm of ileum: Secondary | ICD-10-CM

## 2010-10-26 LAB — CBC WITH DIFFERENTIAL/PLATELET
BASO%: 0.2 % (ref 0.0–2.0)
Basophils Absolute: 0 10*3/uL (ref 0.0–0.1)
EOS%: 1.4 % (ref 0.0–7.0)
HGB: 13 g/dL (ref 13.0–17.1)
MCH: 35.5 pg — ABNORMAL HIGH (ref 27.2–33.4)
NEUT#: 3.6 10*3/uL (ref 1.5–6.5)
RDW: 13 % (ref 11.0–14.6)
lymph#: 1.2 10*3/uL (ref 0.9–3.3)

## 2010-10-26 LAB — COMPREHENSIVE METABOLIC PANEL
ALT: 36 U/L (ref 0–53)
AST: 33 U/L (ref 0–37)
Albumin: 4.4 g/dL (ref 3.5–5.2)
BUN: 15 mg/dL (ref 6–23)
Calcium: 9.2 mg/dL (ref 8.4–10.5)
Chloride: 103 mEq/L (ref 96–112)
Potassium: 4.2 mEq/L (ref 3.5–5.3)
Total Protein: 6.9 g/dL (ref 6.0–8.3)

## 2010-11-28 NOTE — Assessment & Plan Note (Signed)
Ronald OFFICE NOTE   [name], Ronald [name]                      MRN:          [id number]  DATE:12/13/2006                            DOB:          January 01, 1934    Ronald [name] is a 77 year old gentleman with a history of Crohn's colitis  and carcinoid tumor of the terminal ileum resected in November 2003.  He  is currently asymptomatic, followed by Dr. Ralene Ok.  He also has a  history of gastroesophageal reflux disease, esophageal stricture dilated  in the past, last time in 2000.   He is on Nexium 40 mg a day.  Other medications include:  1. __________ 1.2 grams 2 times a day.  2. Tylenol for arthritis.  3. Lisinopril/hydrochlorothiazide 20/12.5 one p.o. daily.  4. Iron supplements.   He had a radical prostatectomy for prostate CA and he has a mild renal  insufficiency.   PHYSICAL EXAMINATION:  Blood pressure 122/68, pulse 72 and weight 187  pounds.  He was alert, oriented and in no distress.  LUNGS:  Clear to auscultation.  COR:  With normal S1, normal S2.  ABDOMEN:  Was soft, nontender with normoactive bowel sounds, well healed  surgical scar.   IMPRESSION:  A 77 year old white male with Crohn's disease, currently in  remission.  Status post terminal ileum resection for carcinoid tumor in  August 22, 2001.  Currently also asymptomatic, followed by Dr.  Ralene Ok.  He is due for a colonoscopy.   PLAN:  1. Continue __________ 2.4 grams a day.  2. Refill for Nexium 40 mg a day.  3. Colonoscopy. The patient unable to schedule because of his busy      schedule during the summer.  He works.  He mows yards full-time and      really does not have time during the week.  He would like to be      notified in October 2008 to have a colonoscopy at that time.  We      will send him a recall letter at that time.     [name]. [name], MD  Electronically Signed    DMB/MedQ  DD: 12/13/2006  DT: 12/13/2006   Job #: [id number]   cc:   Jeanie Cooks, M.D.

## 2010-12-01 NOTE — Discharge Summary (Signed)
NAMEJAWANZA, Ronald [name]                         ACCOUNT NO.:  [id number]   MEDICAL RECORD NO.:  [id number]                   PATIENT TYPE:  INP   LOCATION:  2409                                 FACILITY:  [facility]   PHYSICIAN:  Coralie Keens, M.D.              DATE OF BIRTH:  07-18-1933   DATE OF ADMISSION:  05/19/2002  DATE OF DISCHARGE:  05/24/2002                                 DISCHARGE SUMMARY   SUMMARY OF HISTORY:  Mr. Ronald [name] is a 77 year old gentleman with a  history of Crohn's disease and a chronic stricture in the small bowel, who  was admitted for elective abdominal exploration.   HOSPITAL COURSE:  The patient was admitted on May 19, 2002 and taken to  the operating room where he underwent exploratory laparotomy with a small  bowel resection.  He was found to have a small bowel obstruction with a  tight stricture in the mid ileum.  He was taken in stable condition to the  regular surgical floor.  On postoperative day one, he was doing well.  His  abdomen was soft and nondistended.  His incision appeared clean.  At this  point, we kept him NPO for his postoperative ileus.  By postop day two, his  incision remained clean, and his antibiotics were stopped.  His Foley was  removed.  He was started on a liquid diet.  On postoperative day three, he  did have a postoperative fever, but this was felt to be secondary to  atelectasis.  Pathology of the bowel specimen revealed a carcinoid tumor  with one positive node.  At this point, the pathology was discussed with the  patient and his family.  His diet was advanced.  A follow-up chest x-ray was  performed secondary to the fever which showed some mild atelectasis.  His  temperatures were down by postoperative day four, and he was beginning to  have bowel movements, and his diet was advanced further.  By postoperative  day five, he was doing very well, and he was ambulating, tolerating a  regular diet and oral pain  medications.  The decision was made to discharge  the patient home.   DISCHARGE DIAGNOSIS:  Carcinoid tumor of the bowel, status post exploratory  laparotomy with small bowel resection.   DISCHARGE DIET:  Regular.   DISCHARGE ACTIVITIES:  1. Do no heavy lifting for five weeks.  2. He will resume his home medications.  3. He will take Percocet and Advil for pain.  4. He will shower.  5. He will follow up in my office on the week of discharge.                                               Coralie Keens, M.D.  DB/MEDQ  D:  06/14/2002  T:  06/14/2002  Job:  [id number]

## 2010-12-01 NOTE — Discharge Summary (Signed)
[facility]  Patient:    Ronald [name], DEGREGORIO Visit Number: [id number] MRN: [id number]          Service Type: MED Location: 3W 0539 01 Attending Physician:  Hilda Lias Dictated by:   Alfredia Ferguson, P.A.-C Admit Date:  01/16/2002 Discharge Date: 01/19/2002                             Discharge Summary  ADMISSION DIAGNOSES: 24. A 77 year old male with small bowel obstruction felt secondary to known    distal ileal stricture with new diagnosis of Crohns disease, rule out    possible neoplasm or obstruction secondary to adhesions. 2. Status post left inguinal hernia repair x2, and prior laparotomy for renal    lithiasis. 3. Dehydration and prerenal azotemia secondary to #1.  DISCHARGE DIAGNOSES: 1. Resolved partial small bowel obstruction secondary to distal ileal    stricture. 2. Recently diagnosed Crohns disease. 3. Other diagnoses as listed above.  CONSULTATIONS:  None.  PROCEDURES:  None.  HISTORY OF PRESENT ILLNESS:  The patient is a pleasant 77 year old white male known to Dr. [name] who has followed him for years for chronic reflux and recurrent distal esophageal strictures.  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  He had recent small-bowel follow-through in June 2003, revealing a stricture in the distal ileum approximately two feet from the cecum with some evidence of proximal dilation. The patient had been having some intermittent obstructive symptoms, but had not required prior hospitalization.  Surgical intervention had apparently been discussed with the patient, who at this time is hoping to put that off for a few months.  At this time, he calls on January 16, 2002, complaining of three days of nausea and vomiting and inability to keep down any p.o., including his medications.  He also had no flatus or stool x3 days.   He was evaluated in the emergency room and admitted with abdominal films consistent with a small bowel obstruction for NG decompression and medical management.  LABORATORY DATA:  On 01/16/02, white blood cell count 5.0, hemoglobin 15.4, hematocrit 44.8, MCV 95.8, platelets 228.  Serial values were obtained, and on 01/18/02, white blood cell count was 6.3, hemoglobin 12.2, hematocrit 35.2, platelets 219.  Sedimentation rate 10, prothrombin time 12.9, INR 0.9, PTT of 28.  Electrolytes on admission were within normal limits.  Potassium of 4.0, CO2 was high at 41, glucose 130, BUN 30, creatinine 1.8.  Albumin 3.9. Followup on 01/17/02, BUN 26, creatinine 1.1, and on 01/18/02, BUN was 17, creatinine 1.1.  Albumin 3.2.  Amylase was elevated on admission at 272, lipase normal at 9.  Liver function tests normal with the exception of a total bilirubin of 1.3.  Followup on 01/17/02, liver function tests entirely within normal limits.  Hemoglobin A1C on 01/18/02 was 5.2, normal.  X-ray studies:  KUB on admission showed findings consistent with a small bowel obstruction.  Followup on 01/18/02, showed normal gas pattern with resolved small bowel obstruction.  HOSPITAL COURSE:  The patient was admitted to the service of Dr. Lucio Edward.  A NG was placed for decompression.  He was hydrated with normal saline, placed on IV Solu-Medrol at 30 q.12h., IV Protonix, and given Demerol and Phenergan as needed.  The patients symptoms very quickly improved.  By 01/17/02, he was feeling much better, was  passing flatus, and had significantly decreased pain.  NG was continued, and films were repeated the following morning showing normal gas pattern.  His NG tube was then clamped and discontinued later that day.  He tolerated diet advancement without difficulty.  We discussed surgical consultation while he was in the hospital, but the patient is hoping to avoid surgery until fall for personal reasons, and did not wish to pursue  surgical consult at this time.  On 01/19/02, he was insistent on discharge to home, had tolerated full liquid diet without difficulty, was allowed discharge with instructions to stay on a low residue diet with small frequent feedings.  DISCHARGE MEDICATIONS: 1. Asacol 400 mg four p.o. t.i.d. 2. Prednisone 30 mg p.o. q.d. 3. Robinul p.r.n. 4. Prilosec 20 mg p.o. q.d. 5. Flomax 0.4 mg q.d.  FOLLOWUP:  Dr. [name] in the office in one to two weeks.  He was made an appointment on February 09, 2002, at 9:30 a.m. with Dr. [name].  DISCHARGE INSTRUCTIONS:  The patient was advised to call for any problems in the interim. Dictated by:   Alfredia Ferguson, P.A.-C Attending Physician:  Hilda Lias DD:  01/26/02 TD:  01/28/02 Job: [id number]

## 2010-12-01 NOTE — H&P (Signed)
[facility]  Patient:    Ronald, [name] Visit Number: [id number] MRN: [id number]          Service Type: MED Location: 3W 9449 01 Attending Physician:  Hilda Lias Dictated by:   Alfredia Ferguson, P.A. Admit Date:  01/16/2002 Discharge Date: 01/19/2002                           History and Physical  CHIEF COMPLAINT:  Worsening abdominal pain, nausea and vomiting.  HISTORY OF PRESENT ILLNESS:  The patient is a pleasant 77 year old white male well known to Dr. [name] over many years who has a history of chronic GERD and an esophageal stricture which has required several dilations.  He was last dilated in June 2003 to 18 mm by Savary.  Coincidentally, the patient had complained of five to six weeks worth of crampy abdominal pain which had been intermittent and associated with nausea and vomiting. He had undergone evaluation with Dr. [name] with colonoscopy which was consistent with Crohns colitis.  Small bowel follow through was done in mid-June 2003 revealing a stricture in the distal ileum about two feet from the cecum which some evidence of proximal dilation.  CT scan which had been done October 13, 2001, showed right renal cysts and apparently an abnormal bowel loop in the right lower quadrant with some evidence of edema and fluid.  At this time, the patient presents to the emergency room complaining of three to five days worth of nausea and vomiting, inability to take p.o. over the past 72 hours.  He also reported no stool or flatus over the past three days. He denied any fever or chills.  He had been attempting to keep his medications down but had vomiting them back up as well.  He was seen and evaluated in the emergency room by Dr. Fuller Plan and admitted to the hospital with evidence of small bowel obstruction on plain films for further diagnostic evaluation.  CURRENT MEDICATIONS: 1. Prilosec 20 mg p.o. q.d. 2. Asacol 1.2 g  t.i.d. 3. Prednisone 30 mg q.d. 4. Diazepam 5 mg t.i.d. p.r.n. 5. Tylenol No. 3 q.4h. p.r.n. 6. Robinul Forte one p.o. b.i.d. p.r.n. 7. Bentyl 10 mg q.i.d. p.r.n.  ALLERGIES:  No known drug allergies.  PAST MEDICAL HISTORY:  Pertinent for alcoholism inactive over the past 18 years.  He is status post left inguinal hernia repair x2.  Status post laparotomy for kidney stones and new diagnosis of Crohns disease 2003. Chronic gastroesophageal reflux disease with history of distal esophageal stricture requiring dilation.  FAMILY HISTORY:  Noncontributory for gastrointestinal disease.  SOCIAL HISTORY:  The patient is single.  He is an ex-smoker over the past 30 years and reformed alcoholic with no active ETOH over the past 18 years.  He currently lives alone.  REVIEW OF SYSTEMS:  Cardiovascular:  No current complaints of chest pain or anginal symptoms.  Pulmonary: Negative for cough, shortness of breath or sputum production.  Genitourinary:  Negative for dysuria, urgency or frequency.  Gastrointestinal:  As outlined above.  LABORATORY DATA:  Laboratory studies on admission showed a wbc of 5.0, hemoglobin 15, platelets 228,000.  Total bilirubin 1.3, liver function studies otherwise unremarkable.  Glucose 130.  Creatinine 1.8 on admission and KUB was consistent with a small bowel obstruction.  No free air.  PHYSICAL EXAMINATION:  GENERAL:  Well-developed, well-nourished white male who is uncomfortable but in no acute distress.  Alert  and oriented x 3.  VITAL SIGNS:  Temperature 98.3, blood pressure 106/74, pulse 102, and respirations 24.  HEENT:  Nontraumatic, normocephalic.  EOMI.  Sclerae anicteric.  CHEST:  Clear to auscultation and percussion.  CARDIOVASCULAR:  Regular rate and rhythm with S1 and S2; no murmur, rub or gallop.  ABDOMEN:  Soft, mildly distended.  Bowel sounds are hypoactive.  There is mild diffuse tenderness without rebound or guarding.  No palpable mass  or hepatosplenomegaly.  No palpable hernias.  RECTAL:  Heme negative mucous.  No rectal lesion noted.  NEUROLOGICAL:  The patient was alert and oriented x3.  Exam is otherwise nonfocal.  IMPRESSION: 1. 26 white male with small bowel obstruction with known    distal ileal strictures secondary to Crohns.  Rule out possible neoplasm    or obstruction secondary to adhesions. 2. Dehydration and prerenal azotemia secondary to above. 3. Chronic gastroesophageal reflux disease with history of esophageal    stricture. 4. Status post left inguinal hernia repair x2 and prior laparotomy for    renal lithiasis.  PLAN: 1. The patient is admitted to the service of Dr. Lucio Edward for    IV fluid hydration, pain control, NG decompression. 2. He will be empirically placed on IV Protonix. 3. Will continue steroids but start him on Solu-Medrol 30 mg IV q.12h. 4. We will obtain surgical consultation if his symptoms worsen over the    next 24 hours. 5. For further details, please see the orders. Dictated by:   Alfredia Ferguson, P.A. Attending Physician:  Hilda Lias DD:  01/26/02 TD:  01/27/02 Job: [id number]

## 2010-12-01 NOTE — Op Note (Signed)
[name], [name]                         ACCOUNT NO.:  [id number]   MEDICAL RECORD NO.:  [id number]                   PATIENT TYPE:  INP   LOCATION:  NA                                   FACILITY:  [facility]   PHYSICIAN:  Coralie Keens, M.D.              DATE OF BIRTH:  1934/07/10   DATE OF PROCEDURE:  05/19/2002  DATE OF DISCHARGE:                                 OPERATIVE REPORT   PREOPERATIVE DIAGNOSES:  Chronic small bowel obstruction, Crohn's disease.   POSTOPERATIVE DIAGNOSES:  Chronic small bowel obstruction, Crohn's disease.   PROCEDURES:  Small bowel resection.   SURGEON:  Douglas A. Ninfa Linden, M.D.   ASSISTANT:  Rudell Cobb. Annamaria Boots, M.D.   ANESTHESIA:  General endotracheal.   ESTIMATED BLOOD LOSS:  Minimal.   INDICATIONS:  Ronald [name] is a 77 year old gentleman, who was diagnosed  with Crohn's disease earlier this year by [name], M.D.  He has had  persistent, chronic partial small bowel obstructions.  Therefore, he is  presenting for exploratory laparotomy.  Upon inspection, he was found to  have a single area of obstruction and stricturing of the bowel in the mid  ileum.  No other abnormalities were identified.   PROCEDURE IN DETAIL:  The patient was brought to the operating room and  identified as Ronald [name].  He was placed supine on the operating table,  and general anesthesia was induced.  His abdomen was then prepped and draped  in the usual sterile fashion.  Using a #10 blade, a lower midline incision  was made through a previous incision.  The incision was carried down through  the fascia with the electrocautery.  The peritoneum was then opened the  entire length of the incision.  Upon entering the abdomen, the patient was  found to have a small amount of ascitic fluid.  The proximal small bowel was  moderately dilated.  As the small bowel was eviscerated, the area of  stricturing and obstruction was identified in the mid ileum.  The small  bowel  distal to this appeared normal all the way to the cecum.  At this  point, the small bowel was transected with the GIA 75 stapler just distal to  the obstruction as well as approximately a foot proximal.  The proximal  bowel was again moderately dilated and thickened.  Once the bowel was  transected, the mesentery was taken down with Kelly clamps and 2-0 silk ties  as well as 3-0 silk suture ligatures.  Next, the bowel was reapproximated in  a side-to-side fashion with interrupted silk sutures.  Enterotomies were  created, and then a small side-to-side anastomosis was created with the GIA  75 stapler.  The open end was then closed with the TA 60 stapler.  A large  anastomosis appeared to be created.  Next, the small bowel mesenteric defect  was closed with interrupted silk sutures.  The abdomen  was then copiously  irrigated with normal saline.  Hemostasis appeared to be achieved.  The  midline fascia was then closed with a running #1 Prolene suture.  The  specimen was again sent to pathology for identification.  The skin was then  irrigated and closed with  skin staples.  The patient tolerated the procedure well.  All sponge,  needle, and instrument counts were correct at the end of the procedure.  The  patient was then extubated in the operating room and taken in stable  condition to the recovery room.                                               Coralie Keens, M.D.    DB/MEDQ  D:  05/19/2002  T:  05/19/2002  Job:  [id number]   cc:   [name], M.D. [facility]

## 2010-12-01 NOTE — Procedures (Signed)
[facility]  Patient:    Ronald [name], Ronald [name] Visit Number: [id number] MRN: [id number]          Service Type: END Location: ENDO Attending Physician:  Vincent Peyer Dictated by:   [name]. [name], M.D. [facility] Admit Date:  01/05/2002                             Procedure Report  PROCEDURE:  Upper endoscopy with esophageal dilatation.  ENDOSCOPIST:  [name]. [name], M.D.  INDICATIONS:  This 77 year old white male with Crohns disease has a history of benign distal esophageal stricture which was dilated several years ago.  He has recurrent solid food dysphagia and is undergoing repeat esophageal dilatation.  ENDOSCOPE:  Olympus single-channel video endoscope.  SEDATION:  Versed 7 mg IV, Demerol 50 mg IV.  FINDINGS:  Olympus single-channel video endoscope was passed under direct vision through the posterior pharynx into the esophagus.  The patient was monitored by a pulse oximeter; his oxygen saturations were normal and he was cooperative.  Proximal and mid-esophageal mucosa was normal.  There was marked narrowing in the distal esophagus at the gastroesophageal junction but the endoscope traversed without any pressure and there was no bleeding from the GE junction.  The diameter of the stricture was at least 15 mm.  There was no significant hiatal hernia.  Stomach:  Stomach was insufflated with air and showed normal-appearing gastric folds, antrum and the body of the stomach.  Pyloric outlet was normal. Retroflexion of endoscope revealed normal fundus and cardia.  Duodenum:  Duodenal bulb and descending duodenum were normal.  Guidewire was then placed endoscopically, endoscope was retracted and Savary dilators passed over the guidewire using 15-, 17- and 18-mm dilators without fluoroscopic guidance.  There was a small amount of blood on the last two dilators.  The patient tolerated the procedure well.  IMPRESSION:  Benign distal esophageal  stricture, status post dilation to 54-French.  PLAN: 1. Antireflux measures. 2. Clear liquids for the next two hours. 3. Resume acid-suppressing agents. 4. Return on a p.r.n. basis. Dictated by:   [name]. [name], M.D. [place] Attending Physician:  Vincent Peyer DD:  01/05/02 TD:  01/06/02 Job: [id number]

## 2010-12-06 ENCOUNTER — Other Ambulatory Visit: Payer: Self-pay | Admitting: Internal Medicine

## 2011-01-06 ENCOUNTER — Other Ambulatory Visit: Payer: Self-pay | Admitting: Internal Medicine

## 2011-01-11 ENCOUNTER — Other Ambulatory Visit: Payer: Self-pay | Admitting: Internal Medicine

## 2011-01-11 MED ORDER — MESALAMINE ER 0.375 G PO CP24: ORAL_CAPSULE | ORAL | Status: DC

## 2011-01-11 NOTE — Telephone Encounter (Signed)
Advised patient he must have office visit for refills. Patient has scheduled appointment for 02/14/11.

## 2011-02-03 ENCOUNTER — Other Ambulatory Visit: Payer: Self-pay | Admitting: Internal Medicine

## 2011-02-14 ENCOUNTER — Encounter: Payer: Self-pay | Admitting: Internal Medicine

## 2011-02-14 ENCOUNTER — Ambulatory Visit (INDEPENDENT_AMBULATORY_CARE_PROVIDER_SITE_OTHER): Payer: Medicare Other | Admitting: Internal Medicine

## 2011-02-14 VITALS — BP 170/84 | HR 68 | Ht 74.0 in | Wt 185.0 lb

## 2011-02-14 DIAGNOSIS — K5000 Crohn's disease of small intestine without complications: Secondary | ICD-10-CM

## 2011-02-14 MED ORDER — MESALAMINE ER 0.375 G PO CP24: ORAL_CAPSULE | ORAL | Status: DC

## 2011-02-14 MED ORDER — ESOMEPRAZOLE MAGNESIUM 40 MG PO CPDR: 40.0000 mg | DELAYED_RELEASE_CAPSULE | Freq: Every day | ORAL | Status: DC

## 2011-02-14 NOTE — Progress Notes (Signed)
Ronald [name] 21-May-1934 MRN [id number]    History of Present Illness:  This is a 77 year old white male with Crohn's disease of the terminal ileum and positive IBD markers. His last office visit was in July 2011. This is a one-year followup appointment. He is doing very well, he denies diarrhea, abdominal pain or rectal bleeding. 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 His last appointment with Dr Ralene Ok was in 11/2010. He takes Apriso 0.375 gm 4 capsules daily. He has a history of an esophageal stricture dilated in May 2005. He denies any dysphagia or heartburn. He is taking Nexium 40 mg daily. He needs both Apriso and Nexium refilled. His his weight has been stable. He is lactose intolerant.   Past Medical History  Diagnosis Date  . Kidney stones   . History of alcohol abuse   . Malignant carcinoid tumor of ileum   . Prostate cancer   . Esophageal stricture   . GERD (gastroesophageal reflux disease)   . Crohn's    Past Surgical History  Procedure Date  . Appendectomy   . Tonsillectomy   . Prostatectomy   . Lithotripsy   . Small intestine surgery 05/19/02    reports that he quit smoking about 40 years ago. His smoking use included Cigarettes. He uses smokeless tobacco. He reports that he drinks alcohol. He reports that he does not use illicit drugs. family history includes Breast cancer in his sister; Cirrhosis in his brother; Colon cancer in his father; Diabetes in his sister; and Heart disease in his brother and sister. No Known Allergies      Review of Systems: Negative for dysphagia, odynophagia, abdominal pain or diarrhea  The remainder of the 10  point ROS is negative except as outlined in H&P   Physical Exam: General appearance  Well developed, in no distress. Eyes- non icteric. HEENT nontraumatic, normocephalic. Mouth no lesions, tongue papillated, no cheilosis. Neck supple without  adenopathy, thyroid not enlarged, no carotid bruits, no JVD. Lungs Clear to auscultation bilaterally. Cor normal S1 normal S2, regular rhythm , no murmur,  quiet precordium. Abdomen soft, nontender abdomen with normal active bowel sounds. No distention. Well-healed surgical scar.  Rectal soft Hemoccult negative stool. Extremities no pedal edema. Skin no lesions. Neurological alert and oriented x 3. Psychological normal mood and affect.  Assessment and Plan:  Problem #1 Crohn's disease of the terminal ileum. Patient is currently asymptomatic on a maintenance dose of mesalamine. We will check Dr. Rolan Bucco office results of his recent blood tests. He will continue on the same dose of mesalamine.  Problem #2 history of esophageal stricture. Patient is asymptomatic on Nexium 40 mg daily. He will continue the same medication.  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 He is followed by Dr.Murinson. We will look over recent lab work completed by Dr Ralene Ok.     02/14/2011 Ronald [name]

## 2011-02-14 NOTE — Patient Instructions (Addendum)
We have sent the following medications to your pharmacy for you to pick up at your convenience: Apriso Nexium Please come in 1 year for follow up office visit. CC: Dr Ralene Ok

## 2011-04-24 ENCOUNTER — Other Ambulatory Visit (HOSPITAL_COMMUNITY): Payer: Self-pay | Admitting: Oncology

## 2011-04-24 ENCOUNTER — Encounter (HOSPITAL_BASED_OUTPATIENT_CLINIC_OR_DEPARTMENT_OTHER): Payer: Medicare Other | Admitting: Oncology

## 2011-04-24 DIAGNOSIS — Z8509 Personal history of malignant neoplasm of other digestive organs: Secondary | ICD-10-CM

## 2011-04-24 DIAGNOSIS — E785 Hyperlipidemia, unspecified: Secondary | ICD-10-CM

## 2011-04-24 DIAGNOSIS — Z9889 Other specified postprocedural states: Secondary | ICD-10-CM

## 2011-04-24 DIAGNOSIS — Z8546 Personal history of malignant neoplasm of prostate: Secondary | ICD-10-CM

## 2011-04-24 DIAGNOSIS — N189 Chronic kidney disease, unspecified: Secondary | ICD-10-CM

## 2011-04-24 LAB — CBC WITH DIFFERENTIAL/PLATELET
Basophils Absolute: 0 10*3/uL (ref 0.0–0.1)
EOS%: 2.2 % (ref 0.0–7.0)
HCT: 37.3 % — ABNORMAL LOW (ref 38.4–49.9)
HGB: 13.1 g/dL (ref 13.0–17.1)
LYMPH%: 25.4 % (ref 14.0–49.0)
MCH: 35.9 pg — ABNORMAL HIGH (ref 27.2–33.4)
MCV: 102.3 fL — ABNORMAL HIGH (ref 79.3–98.0)
MONO%: 5.5 % (ref 0.0–14.0)
NEUT%: 66.4 % (ref 39.0–75.0)

## 2011-04-24 LAB — COMPREHENSIVE METABOLIC PANEL
ALT: 55 U/L — ABNORMAL HIGH (ref 0–53)
BUN: 18 mg/dL (ref 6–23)
CO2: 24 mEq/L (ref 19–32)
Calcium: 9.2 mg/dL (ref 8.4–10.5)
Creatinine, Ser: 1.56 mg/dL — ABNORMAL HIGH (ref 0.50–1.35)
Total Bilirubin: 0.4 mg/dL (ref 0.3–1.2)

## 2011-04-24 LAB — LACTATE DEHYDROGENASE: LDH: 127 U/L (ref 94–250)

## 2011-04-24 LAB — PSA: PSA: 0.12 ng/mL (ref <=4.00)

## 2011-06-14 ENCOUNTER — Telehealth: Payer: Self-pay | Admitting: *Deleted

## 2011-06-14 NOTE — Telephone Encounter (Signed)
left message to inform the patient of the new date and time for 10-2011. Asked patient to please stop by scheduling to pick up the new appointment for [phone]

## 2011-06-25 ENCOUNTER — Other Ambulatory Visit: Payer: Self-pay | Admitting: Oncology

## 2011-06-25 DIAGNOSIS — F1021 Alcohol dependence, in remission: Secondary | ICD-10-CM

## 2011-06-25 DIAGNOSIS — C7A012 Malignant carcinoid tumor of the ileum: Secondary | ICD-10-CM

## 2011-06-25 DIAGNOSIS — R945 Abnormal results of liver function studies: Secondary | ICD-10-CM | POA: Insufficient documentation

## 2011-06-26 ENCOUNTER — Other Ambulatory Visit (HOSPITAL_BASED_OUTPATIENT_CLINIC_OR_DEPARTMENT_OTHER): Payer: Medicare Other | Admitting: Lab

## 2011-06-26 DIAGNOSIS — F1021 Alcohol dependence, in remission: Secondary | ICD-10-CM

## 2011-06-26 DIAGNOSIS — R945 Abnormal results of liver function studies: Secondary | ICD-10-CM

## 2011-06-26 DIAGNOSIS — C7A012 Malignant carcinoid tumor of the ileum: Secondary | ICD-10-CM

## 2011-06-26 DIAGNOSIS — C172 Malignant neoplasm of ileum: Secondary | ICD-10-CM

## 2011-06-26 LAB — COMPREHENSIVE METABOLIC PANEL
ALT: 31 U/L (ref 0–53)
AST: 29 U/L (ref 0–37)
Albumin: 4.1 g/dL (ref 3.5–5.2)
CO2: 26 mEq/L (ref 19–32)
Calcium: 9.3 mg/dL (ref 8.4–10.5)
Chloride: 103 mEq/L (ref 96–112)
Creatinine, Ser: 1.39 mg/dL — ABNORMAL HIGH (ref 0.50–1.35)
Potassium: 4.3 mEq/L (ref 3.5–5.3)
Total Protein: 6.6 g/dL (ref 6.0–8.3)

## 2011-06-26 LAB — LACTATE DEHYDROGENASE: LDH: 136 U/L (ref 94–250)

## 2011-07-06 ENCOUNTER — Other Ambulatory Visit: Payer: Self-pay | Admitting: Internal Medicine

## 2011-07-09 ENCOUNTER — Other Ambulatory Visit: Payer: Self-pay | Admitting: Internal Medicine

## 2011-08-10 ENCOUNTER — Other Ambulatory Visit: Payer: Self-pay | Admitting: Internal Medicine

## 2011-10-01 ENCOUNTER — Telehealth: Payer: Self-pay | Admitting: Oncology

## 2011-10-01 ENCOUNTER — Encounter: Payer: Self-pay | Admitting: Medical Oncology

## 2011-10-01 NOTE — Telephone Encounter (Signed)
per rb the needs to move appt to 4/18 but there are no avail slots,pt advised,advised to c/b for another day if needed   aom

## 2011-10-08 ENCOUNTER — Telehealth: Payer: Self-pay | Admitting: Oncology

## 2011-10-08 NOTE — Telephone Encounter (Signed)
l/m with new appt per rb for 4/29 aom

## 2011-10-21 IMAGING — CR DG CHEST 2V
2 series · 2 of 2 positions shown · non-contrast
Comparison: 04/19/2008

CLINICAL DATA: History of carcinoid tumor.

CHEST - 2 VIEW

[w chest pa]
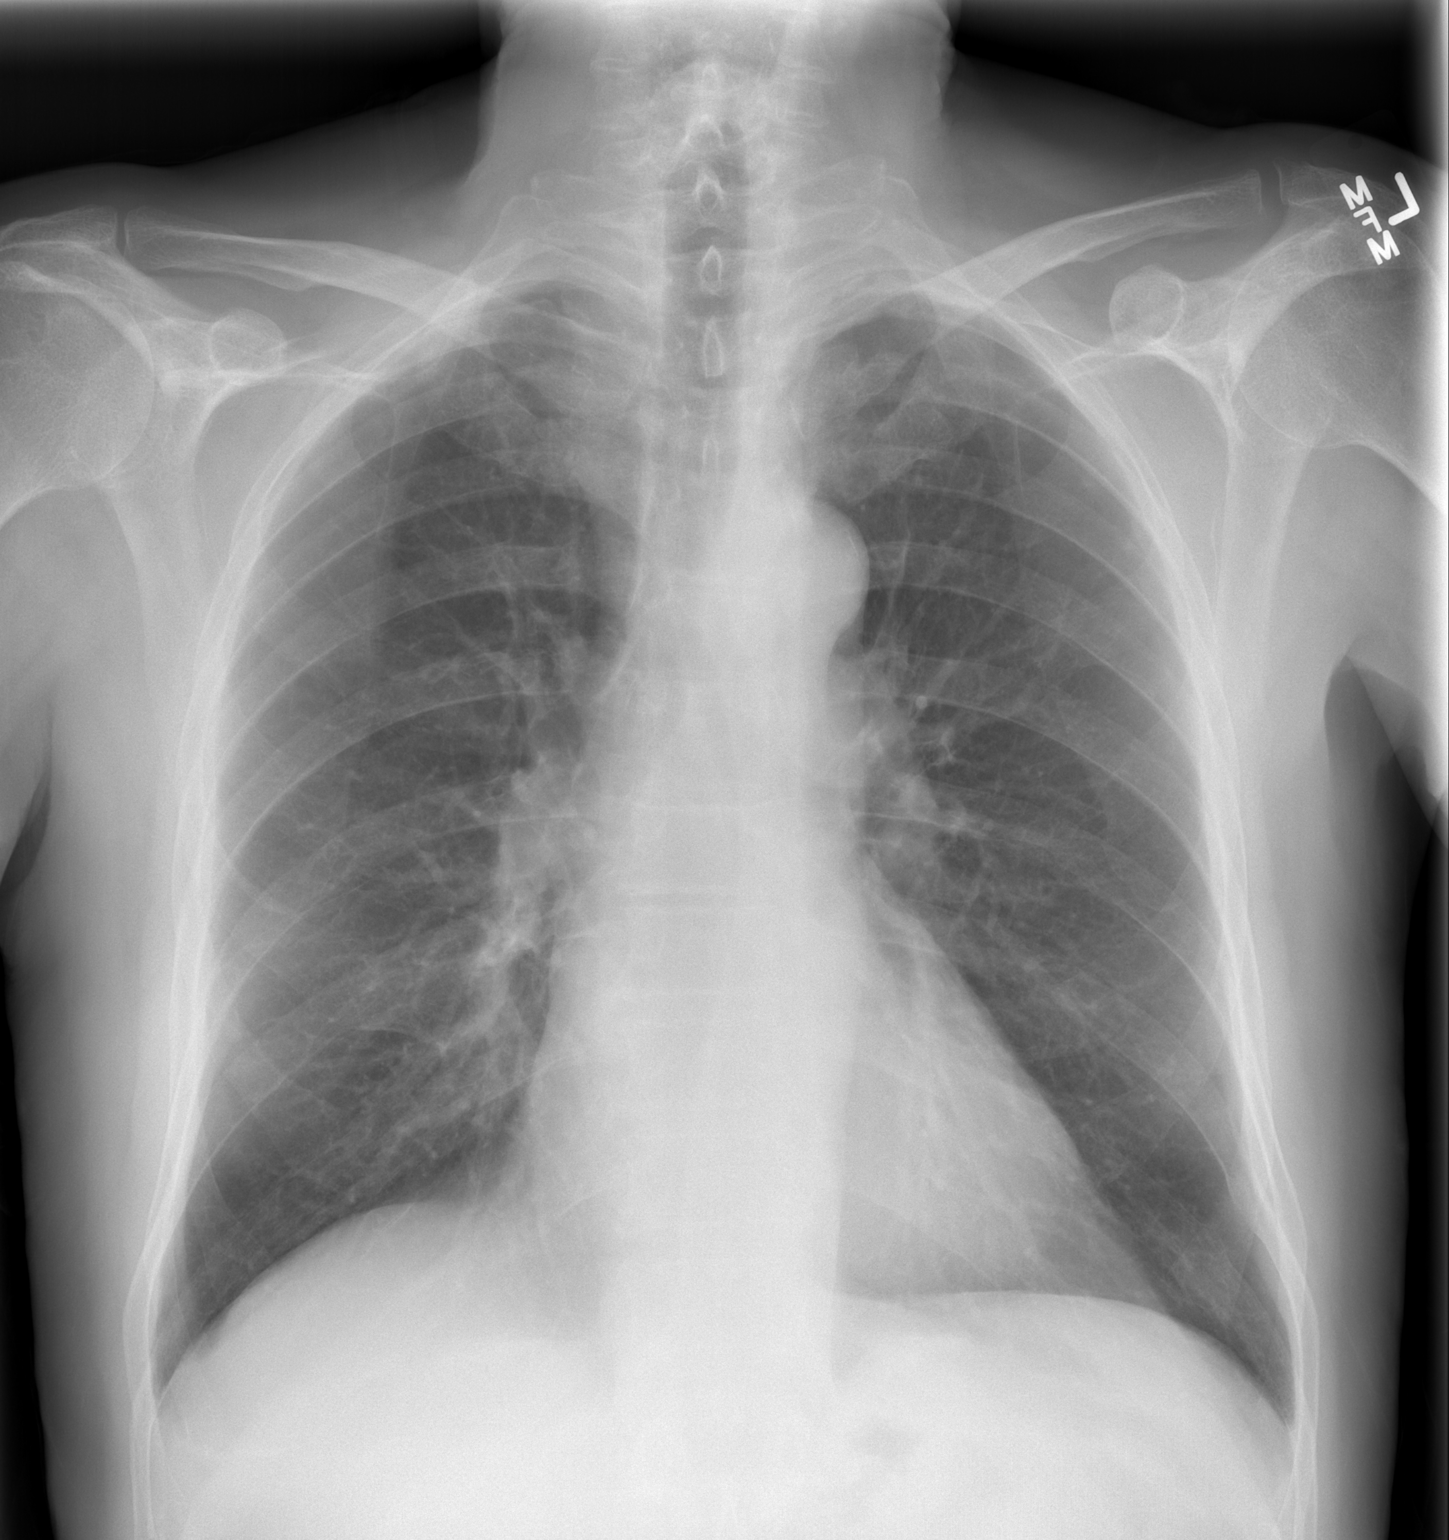

[w chest lat]
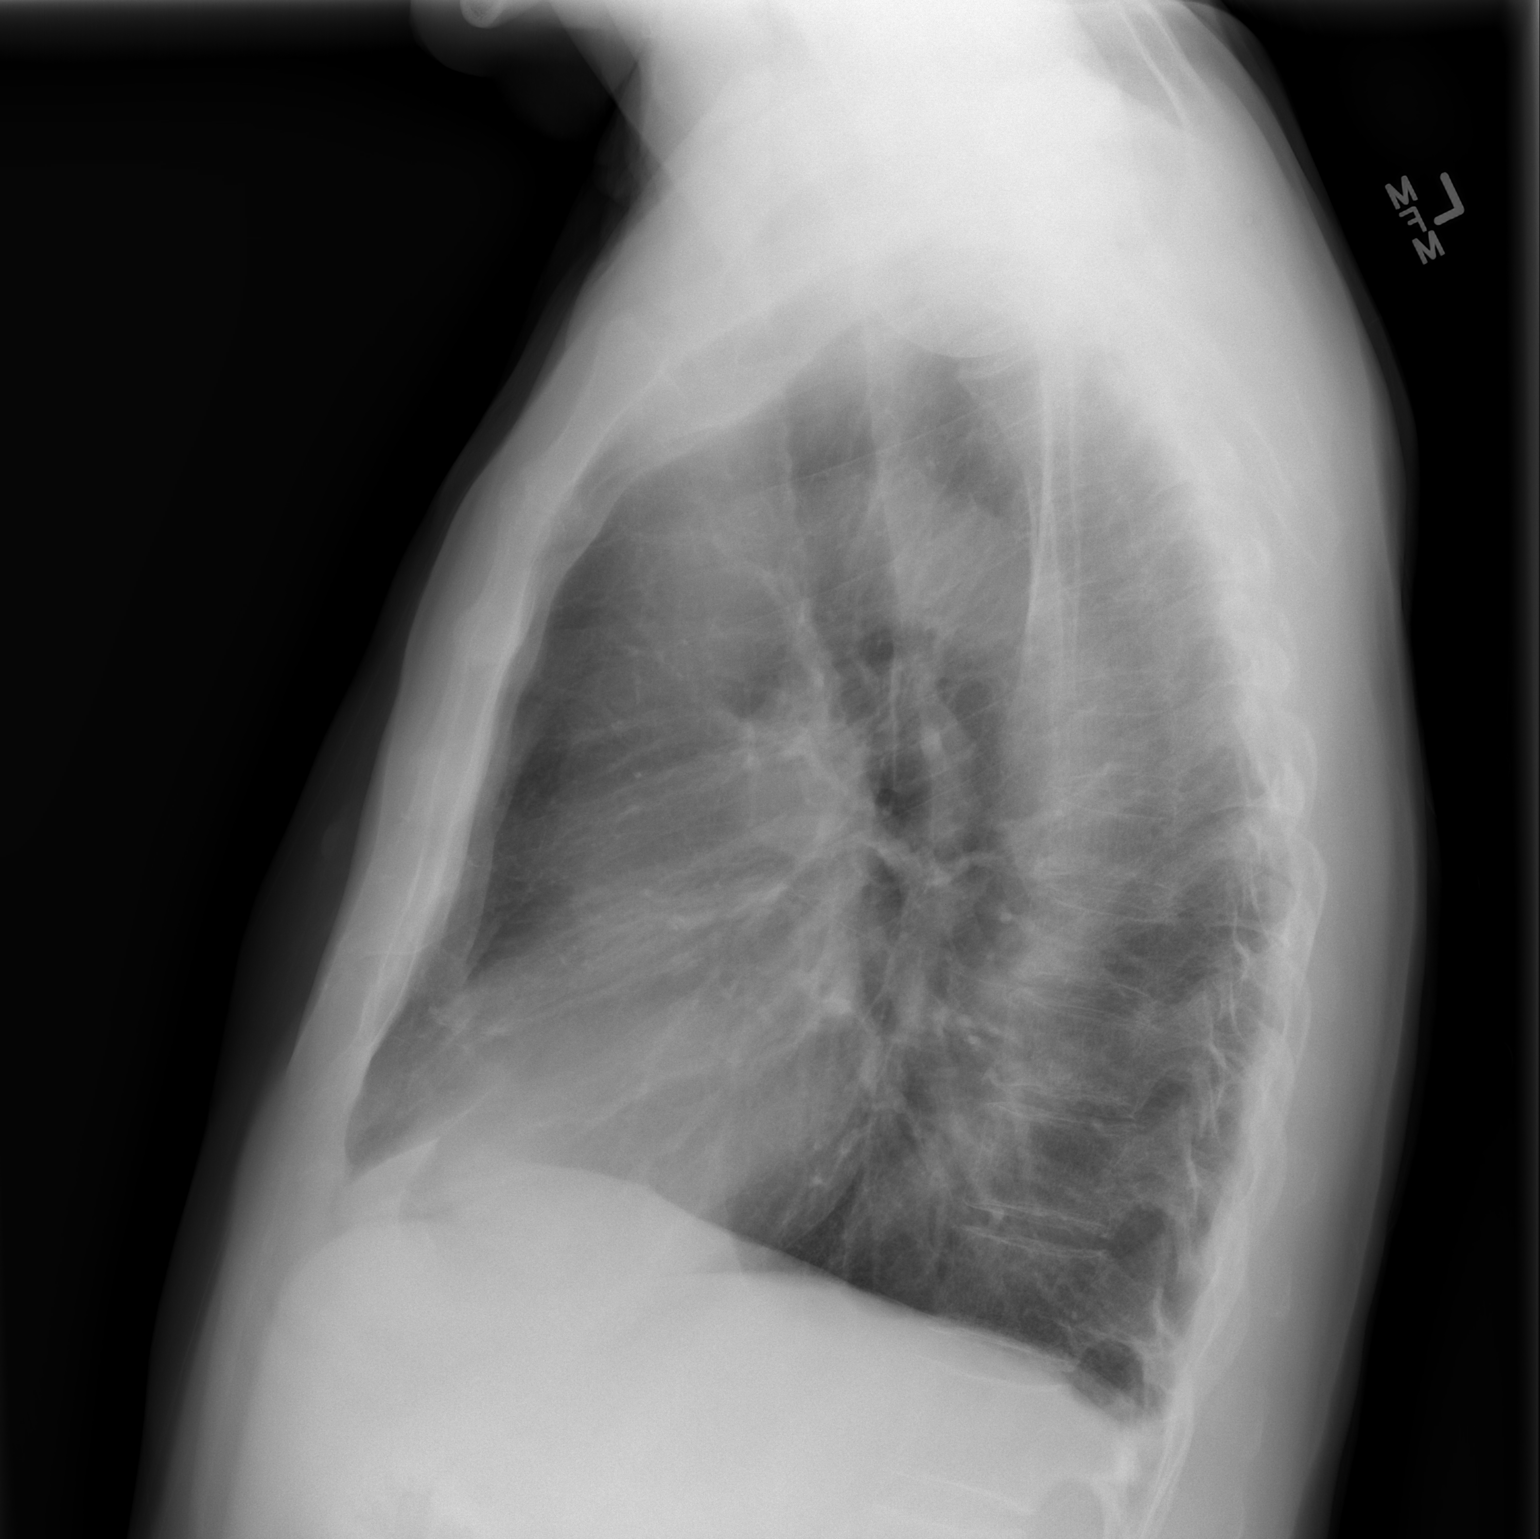

[2 of 2 positions shown; findings below may reference images not displayed]

FINDINGS: The cardiomediastinal silhouette is unremarkable.
The lungs are clear.
No pulmonary  nodules or masses are identified.
There is no evidence of focal airspace disease, pulmonary edema,
pleural effusion, or pneumothorax.
No acute bony abnormalities are identified.
IMPRESSION: No evidence of active cardiopulmonary disease.

## 2011-10-23 ENCOUNTER — Ambulatory Visit: Payer: Medicare Other | Admitting: Oncology

## 2011-10-23 ENCOUNTER — Other Ambulatory Visit: Payer: Medicare Other | Admitting: Lab

## 2011-11-12 ENCOUNTER — Encounter: Payer: Self-pay | Admitting: Oncology

## 2011-11-12 ENCOUNTER — Telehealth: Payer: Self-pay | Admitting: Oncology

## 2011-11-12 ENCOUNTER — Ambulatory Visit (HOSPITAL_BASED_OUTPATIENT_CLINIC_OR_DEPARTMENT_OTHER): Payer: Medicare Other | Admitting: Oncology

## 2011-11-12 ENCOUNTER — Other Ambulatory Visit: Payer: Self-pay | Admitting: Oncology

## 2011-11-12 ENCOUNTER — Other Ambulatory Visit: Payer: Medicare Other | Admitting: Lab

## 2011-11-12 ENCOUNTER — Other Ambulatory Visit: Payer: Self-pay | Admitting: Medical Oncology

## 2011-11-12 VITALS — BP 195/82 | HR 59 | Temp 97.0°F | Ht 74.0 in | Wt 192.7 lb

## 2011-11-12 DIAGNOSIS — C7A012 Malignant carcinoid tumor of the ileum: Secondary | ICD-10-CM

## 2011-11-12 DIAGNOSIS — D539 Nutritional anemia, unspecified: Secondary | ICD-10-CM

## 2011-11-12 DIAGNOSIS — Z8546 Personal history of malignant neoplasm of prostate: Secondary | ICD-10-CM

## 2011-11-12 DIAGNOSIS — D649 Anemia, unspecified: Secondary | ICD-10-CM

## 2011-11-12 DIAGNOSIS — C7A1 Malignant poorly differentiated neuroendocrine tumors: Secondary | ICD-10-CM

## 2011-11-12 DIAGNOSIS — E785 Hyperlipidemia, unspecified: Secondary | ICD-10-CM

## 2011-11-12 LAB — CBC WITH DIFFERENTIAL/PLATELET
Basophils Absolute: 0 10*3/uL (ref 0.0–0.1)
HCT: 32.5 % — ABNORMAL LOW (ref 38.4–49.9)
HGB: 10.9 g/dL — ABNORMAL LOW (ref 13.0–17.1)
LYMPH%: 11.9 % — ABNORMAL LOW (ref 14.0–49.0)
MCH: 35.4 pg — ABNORMAL HIGH (ref 27.2–33.4)
MONO#: 0.2 10*3/uL (ref 0.1–0.9)
NEUT%: 84.6 % — ABNORMAL HIGH (ref 39.0–75.0)
Platelets: 216 10*3/uL (ref 140–400)
WBC: 8 10*3/uL (ref 4.0–10.3)
lymph#: 1 10*3/uL (ref 0.9–3.3)

## 2011-11-12 LAB — COMPREHENSIVE METABOLIC PANEL
AST: 20 U/L (ref 0–37)
Albumin: 4.3 g/dL (ref 3.5–5.2)
Alkaline Phosphatase: 43 U/L (ref 39–117)
BUN: 17 mg/dL (ref 6–23)
Calcium: 9.2 mg/dL (ref 8.4–10.5)
Chloride: 104 mEq/L (ref 96–112)
Creatinine, Ser: 1.36 mg/dL — ABNORMAL HIGH (ref 0.50–1.35)
Glucose, Bld: 109 mg/dL — ABNORMAL HIGH (ref 70–99)
Potassium: 4 mEq/L (ref 3.5–5.3)

## 2011-11-12 LAB — RETICULOCYTES
Immature Retic Fract: 10.3 % (ref 3.00–10.60)
RBC: 3.15 10*6/uL — ABNORMAL LOW (ref 4.20–5.82)
Retic Ct Abs: 68.36 10*3/uL (ref 34.80–93.90)

## 2011-11-12 LAB — CHCC SMEAR

## 2011-11-12 LAB — MORPHOLOGY: PLT EST: ADEQUATE

## 2011-11-12 NOTE — Progress Notes (Signed)
CC:   Ronald [name]. Ronald Perches, MD Ronald Penna, MD Ronald Aguas, MD  PROBLEM LIST: 1. 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  The patient underwent     small bowel resection on 05/19/2002. 2. Adenocarcinoma of the prostate, status post surgery on 06/30/2003. 3. Renal insufficiency. 4. Anemia with elevated red cell indices. 5. History of Crohn's disease dating back to 10/2001. 6. COPD. 7. History of kidney stones. 8. Hypertension. 9. Dyslipidemia. 10.Gout diagnosed 10/2011.  Symptoms affect the left foot, being     treated with Medrol. 11.History of alcohol use.  MEDICATIONS: 1. Hemocyte Plus 1 tablet twice daily. 2. Lasix 20 mg every other day. 3. Hydrochlorothiazide 12.5 mg daily. 4. Labetalol 100 mg twice daily. 5. Lisinopril 20 mg daily. 6. Apriso 4 tablets by mouth every morning. 7. Medrol 4 mg daily for gout. 8. Nexium 40 mg daily. 9. Sodium bicarbonate 650 mg twice daily.  HISTORY: I saw Ronald [name] today for followup of his carcinoid tumor involving the mid ileum status post small bowel resection dating back to 05/2002, T3 N1, stage III.  Ronald [name] also underwent surgery for prostate cancer at [facility] on 06/30/2003.  He seems to be doing well from the standpoint of both of these cancers without evidence of recurrence or any symptomatology to suggest recurrence.  He lives in a mobile home near [place].  He is without complaints today.  He denies any obvious blood in the stool but he states stools are black which he attributes to his iron.  He feels generally well with no loss of energy, abdominal symptomatology, trouble eating or weight loss.  He will be turning 78 on 05/11.  He drinks 2-3 beers a day, less than he did in the past.  PHYSICAL EXAMINATION:  He looks well.  Weight is 192.7 pounds.  Height 6 feet 2 inches.  Body surface area 2.14 meter squared.  Blood  pressure today 195/82.  The patient was made aware of his elevated blood pressure.  He has had elevated blood pressures in the past.  Blood pressure on 04/24/2011 was 165/83.  There is no scleral icterus.  Mouth and pharynx are benign.  There is no peripheral adenopathy palpable. Heart and lungs are normal.  Abdomen is benign with no organomegaly or masses palpable.  Abdomen is soft, nontender.  Extremities, no peripheral edema or clubbing.  Neurologic exam was nonfocal.  LABORATORY DATA:  Today, white count 8.0, ANC 6.8, hemoglobin 10.9, hematocrit 32.5, platelets 216,000.  On 10/09 hemoglobin was 13.1, hematocrit 37.3.  MCV 105.1, MCH 35.4 today.  Differential was normal. The patient has had fairly persistent elevation of red cell indices over the past 2-3 years.  Chemistries and PSA are pending.  Chemistries from 06/26/2011 were notable for a BUN of 15, creatinine 1.39.  Liver function tests were normal.  On 04/24/2011 when the patient was last seen by [place] the BUN was 18, creatinine 1.56 and the AST was 40, ALT 55. These were repeated on 12/11, AST was 29, ALT 31.  Additional studies today for the patient's anemia include stools for occult blood, retic count, slide review, iron, TIBC, ferritin, red cell folate and vitamin B12 level.  IMAGING STUDIES:  Two view chest x-ray from 04/27/2010 was negative.  IMPRESSION AND PLAN:  Clinically Ronald [name] continues to do well.  He is about to be 78 on 05/11.  He is  without any complaints today.  He is now out 9-1/2 years from his carcinoid tumor and 8-1/2 years from his prostate cancer without evidence of recurrence.  Today the patient's hemoglobin and hematocrit have dropped from his previous baseline.  His renal function and chemistries are currently pending.  Because of his elevated red cell indices we are checking for folate and vitamin B12 deficiency.  If in fact these come back negative then one consideration would be refractory anemia  associated with myelodysplastic syndrome.  We are also checking the stools for occult blood to rule out GI bleeding. He does have Crohn's disease.  He was recently put on Medrol for gout attack involving his left foot.  Ronald [name] was made aware of the drop in his hemoglobin and hematocrit and also his elevated blood pressure today.  At his request we will plan to see Ronald [name] again in 6 months, at which time we will check CBC, chemistries, PSA and vitamin B12 level. As stated, he was given stool cards so we can check for occult blood.    ______________________________ Jeanie Cooks, M.D. DSM/MEDQ  D:  11/12/2011  T:  11/12/2011  Job:  [id number]

## 2011-11-12 NOTE — Progress Notes (Signed)
This office note has been dictated.  #[id number]

## 2011-11-12 NOTE — Telephone Encounter (Signed)
Gave pt appt for October 29th lab and md.

## 2011-11-13 ENCOUNTER — Encounter: Payer: Self-pay | Admitting: Oncology

## 2011-11-13 LAB — IRON AND TIBC
Iron: 102 ug/dL (ref 42–165)
UIBC: 149 ug/dL (ref 125–400)

## 2011-11-13 LAB — VITAMIN B12: Vitamin B-12: 317 pg/mL (ref 211–911)

## 2011-11-13 NOTE — Progress Notes (Signed)
Ferritin from 11/12/2011 came back 1960. Patient is currently taking Hemocyte twice a day. We will contact him and recommend that he stop taking all iron preparations.

## 2011-11-14 ENCOUNTER — Telehealth: Payer: Self-pay | Admitting: Nurse Practitioner

## 2011-11-14 NOTE — Telephone Encounter (Signed)
Notified pt per Dr. Ralene Ok- ferritin level too high- pt needs to stop taking iron pills.  Pt stated he takes two iron pills a day.  Refused to stop iron altogether.  Stated he would agree to decrease iron pill to one every other day.  RN advised that too much iron can be damaging to his vital organs.  Pt stated "without my iron I'm not worth two cents".  RN again reiterated importance of following MD's directions.  Pt verbalized understanding.

## 2012-03-18 ENCOUNTER — Other Ambulatory Visit: Payer: Self-pay | Admitting: Internal Medicine

## 2012-03-18 MED ORDER — MESALAMINE ER 0.375 G PO CP24: ORAL_CAPSULE | ORAL | Status: DC

## 2012-03-18 MED ORDER — ESOMEPRAZOLE MAGNESIUM 40 MG PO CPDR: 40.0000 mg | DELAYED_RELEASE_CAPSULE | Freq: Every day | ORAL | Status: DC

## 2012-03-18 NOTE — Telephone Encounter (Signed)
1 month rx sent for Apriso and Nexium. Patient needs office visit for further refills.

## 2012-04-02 ENCOUNTER — Telehealth: Payer: Self-pay | Admitting: *Deleted

## 2012-04-02 ENCOUNTER — Telehealth: Payer: Self-pay | Admitting: Internal Medicine

## 2012-04-02 NOTE — Telephone Encounter (Signed)
Error

## 2012-04-02 NOTE — Telephone Encounter (Signed)
Spoke with patient and scheduled him on 04/22/12 at 10:00 AM.

## 2012-04-11 ENCOUNTER — Telehealth: Payer: Self-pay | Admitting: Internal Medicine

## 2012-04-11 MED ORDER — ESOMEPRAZOLE MAGNESIUM 40 MG PO CPDR: 40.0000 mg | DELAYED_RELEASE_CAPSULE | Freq: Every day | ORAL | Status: DC

## 2012-04-11 MED ORDER — MESALAMINE ER 0.375 G PO CP24: ORAL_CAPSULE | ORAL | Status: DC

## 2012-04-11 NOTE — Telephone Encounter (Signed)
rx sent

## 2012-04-22 ENCOUNTER — Encounter: Payer: Self-pay | Admitting: Internal Medicine

## 2012-04-22 ENCOUNTER — Ambulatory Visit (INDEPENDENT_AMBULATORY_CARE_PROVIDER_SITE_OTHER): Payer: Medicare Other | Admitting: Internal Medicine

## 2012-04-22 VITALS — BP 172/84 | HR 60 | Ht 74.0 in | Wt 187.0 lb

## 2012-04-22 DIAGNOSIS — D490 Neoplasm of unspecified behavior of digestive system: Secondary | ICD-10-CM

## 2012-04-22 DIAGNOSIS — R945 Abnormal results of liver function studies: Secondary | ICD-10-CM

## 2012-04-22 DIAGNOSIS — R7989 Other specified abnormal findings of blood chemistry: Secondary | ICD-10-CM

## 2012-04-22 DIAGNOSIS — K5010 Crohn's disease of large intestine without complications: Secondary | ICD-10-CM

## 2012-04-22 DIAGNOSIS — D3A098 Benign carcinoid tumors of other sites: Secondary | ICD-10-CM

## 2012-04-22 MED ORDER — MESALAMINE ER 0.375 G PO CP24: ORAL_CAPSULE | ORAL | Status: DC

## 2012-04-22 MED ORDER — ESOMEPRAZOLE MAGNESIUM 40 MG PO CPDR: 40.0000 mg | DELAYED_RELEASE_CAPSULE | Freq: Every day | ORAL | Status: DC

## 2012-04-22 NOTE — Progress Notes (Signed)
Ronald [name] 1933-09-21 MRN [id number]   History of Present Illness:  This is a 78 year old white male with Crohn's disease of the terminal ileum and a known family history of colon cancer in his father. H ehas IBD markers. He has a history of a malignant carcinoid tumor of the distal ileum which was resected in November 2003. It was a stage III T3 N1, followed by Dr. Ralene Ok. His last appointment with him was in April 2013. He comes for a yearly followup. He has no complaints as far as diarrhea, abdominal pain or rectal bleeding. He has been on Apriso .375, 4 tablets a day. There is a history of an esophageal stricture which was dilated in May 2005. He has been on Nexium 40 mg daily. He currently denies any dysphagia. There is a history of lactose intolerance. His last colonoscopy and endoscopy in May 2009 showed a normal cecum and ileocecal valve as well as mild esophageal stricture. He has a history of anemia but his iron studies have been normal. He has mild macrocytosis. He also has renal insufficiency with a creatinine of 1.36.   Past Medical History  Diagnosis Date  . Kidney stones   . History of alcohol abuse   . Malignant carcinoid tumor of ileum   . Prostate cancer   . Esophageal stricture   . GERD (gastroesophageal reflux disease)   . Crohn's    Past Surgical History  Procedure Date  . Appendectomy   . Tonsillectomy   . Prostatectomy   . Lithotripsy   . Small intestine surgery 05/19/02    reports that he quit smoking about 41 years ago. His smoking use included Cigarettes. He uses smokeless tobacco. He reports that he drinks alcohol. He reports that he does not use illicit drugs. family history includes Breast cancer in his sister; Cirrhosis in his brother; Colon cancer in his father; Diabetes in his sister; and Heart disease in his brother and sister. No Known Allergies      Review of Systems: Negative for dysphagia odynophagia or chest pain  The remainder of the 10  point ROS is negative except as outlined in H&P   Physical Exam: General appearance  Well developed, in no distress. Eyes- non icteric. HEENT nontraumatic, normocephalic. Mouth no lesions, tongue papillated, no cheilosis. Neck supple without adenopathy, thyroid not enlarged, no carotid bruits, no JVD. Lungs Clear to auscultation bilaterally. Cor normal S1, normal S2, regular rhythm, no murmur,  quiet precordium. Abdomen: Soft nontender with normoactive bowel sounds. Liver edge at costal margin. No distention. Rectal: Soft Hemoccult negative stool. Extremities no pedal edema. Skin no lesions. Neurological alert and oriented x 3. Psychological normal mood and affect.  Assessment and Plan:  Problem #1 History of Crohn's disease of the terminal ileum and colon, in remission. He will continue the same mesalamine dose. A recall colonoscopy is suggested for May 2014. There is no evidence of occult GI blood loss.  Problem #2 History of abnormal liver function tests. His most recent LFTs were normal. He has a history of alcohol use. We will obtain an upper abdominal ultrasound of the liver and gallbladder.  Problem #3 History of esophageal stricture. Patient is currently asymptomatic on Nexium 40 mg a day.   04/22/2012 [name]

## 2012-04-22 NOTE — Patient Instructions (Addendum)
You have been scheduled for an abdominal ultrasound at [facility] (1st floor of hospital) on Thursday 04/24/12 at 8:30 am. Please arrive 15 minutes prior to your appointment for registration. Make certain not to have anything to eat or drink 6 hours prior to your appointment. Should you need to reschedule your appointment, please contact radiology at [phone]. We have sent the following medications to your pharmacy for you to pick up at your convenience: Nexium Apriso Dr Ralene Ok

## 2012-04-24 ENCOUNTER — Ambulatory Visit (HOSPITAL_COMMUNITY)
Admission: RE | Admit: 2012-04-24 | Discharge: 2012-04-24 | Disposition: A | Discharge status: Home / Self Care | Payer: Medicare Other | Source: Ambulatory Visit | Attending: Internal Medicine | Admitting: Internal Medicine

## 2012-04-24 DIAGNOSIS — R7989 Other specified abnormal findings of blood chemistry: Secondary | ICD-10-CM | POA: Insufficient documentation

## 2012-04-24 DIAGNOSIS — N269 Renal sclerosis, unspecified: Secondary | ICD-10-CM | POA: Insufficient documentation

## 2012-04-24 DIAGNOSIS — R945 Abnormal results of liver function studies: Secondary | ICD-10-CM | POA: Insufficient documentation

## 2012-05-13 ENCOUNTER — Other Ambulatory Visit (HOSPITAL_BASED_OUTPATIENT_CLINIC_OR_DEPARTMENT_OTHER): Payer: Medicare Other

## 2012-05-13 ENCOUNTER — Ambulatory Visit (HOSPITAL_BASED_OUTPATIENT_CLINIC_OR_DEPARTMENT_OTHER): Payer: Medicare Other | Admitting: Family

## 2012-05-13 ENCOUNTER — Telehealth: Payer: Self-pay | Admitting: Oncology

## 2012-05-13 ENCOUNTER — Ambulatory Visit: Payer: Medicare Other | Admitting: Oncology

## 2012-05-13 ENCOUNTER — Encounter: Payer: Self-pay | Admitting: Family

## 2012-05-13 VITALS — BP 199/90 | HR 63 | Temp 98.4°F | Resp 20 | Ht 74.0 in | Wt 187.0 lb

## 2012-05-13 DIAGNOSIS — Z8546 Personal history of malignant neoplasm of prostate: Secondary | ICD-10-CM

## 2012-05-13 DIAGNOSIS — C7A012 Malignant carcinoid tumor of the ileum: Secondary | ICD-10-CM

## 2012-05-13 DIAGNOSIS — N289 Disorder of kidney and ureter, unspecified: Secondary | ICD-10-CM

## 2012-05-13 DIAGNOSIS — Z8509 Personal history of malignant neoplasm of other digestive organs: Secondary | ICD-10-CM

## 2012-05-13 DIAGNOSIS — D539 Nutritional anemia, unspecified: Secondary | ICD-10-CM

## 2012-05-13 DIAGNOSIS — D649 Anemia, unspecified: Secondary | ICD-10-CM

## 2012-05-13 LAB — CBC WITH DIFFERENTIAL/PLATELET
Basophils Absolute: 0 10*3/uL (ref 0.0–0.1)
Eosinophils Absolute: 0.1 10*3/uL (ref 0.0–0.5)
HCT: 38.3 % — ABNORMAL LOW (ref 38.4–49.9)
HGB: 13.7 g/dL (ref 13.0–17.1)
MCV: 102.7 fL — ABNORMAL HIGH (ref 79.3–98.0)
MONO%: 5.1 % (ref 0.0–14.0)
NEUT#: 2.9 10*3/uL (ref 1.5–6.5)
NEUT%: 64.8 % (ref 39.0–75.0)
RDW: 12.5 % (ref 11.0–14.6)
lymph#: 1.2 10*3/uL (ref 0.9–3.3)

## 2012-05-13 LAB — COMPREHENSIVE METABOLIC PANEL (CC13)
ALT: 28 U/L (ref 0–55)
AST: 28 U/L (ref 5–34)
Alkaline Phosphatase: 53 U/L (ref 40–150)
BUN: 13 mg/dL (ref 7.0–26.0)
Creatinine: 1.3 mg/dL (ref 0.7–1.3)
Potassium: 4.1 mEq/L (ref 3.5–5.1)

## 2012-05-13 LAB — VITAMIN B12: Vitamin B-12: 306 pg/mL (ref 211–911)

## 2012-05-13 NOTE — Progress Notes (Signed)
Patient ID: Ronald [name], male   DOB: 08-23-1933, 78 y.o.   MRN: [id number] CSN: [id number]  CC: Ronald [name]. Ronald Perches, MD  Ronald Penna, MD  Ronald Aguas, MD   Problem List: Ronald [name] is a 78 y.o. Caucasian male with a problem list consisting of:  1. 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 The patient underwent small bowel resection on 05/19/2002.  2. Adenocarcinoma of the prostate, status post surgery on 06/30/2003 3. Renal insufficiency  4. Anemia with elevated red cell indices  5. History of Crohn's disease dating back to 10/2001  6. COPD  7. History of kidney stones  8. Hypertension  9. Dyslipidemia  10.Gout diagnosed 10/2011. Symptoms affect the left foot, being treated with Medrol  11.History of alcohol use 12.History of elevated LFTs 13.History of elevated MCV  Dr. Ralene Ok and I saw Ronald [name] today for followup of his carcinoid tumor involving the mid ileum status post small bowel resection dating back to 05/2002, T3 N1, stage III. Ronald [name] also underwent surgery for prostate cancer at [facility] on 06/30/2003. Hist last office visit was on 11/12/2011.  He seems to be doing well from the standpoint of both of these cancers without evidence of recurrence or any symptomatology to suggest recurrence. He lives in a mobile home near [place]. He is without complaints today. He denies any symptomatology including obvious blood in his stool or bleeding, fever, chills, SOB, pain, N/V/D or constipation.  Ronald [name] states that he lost the stool cards, but he did see Dr. Olevia [name] last month and was told that his stool does not have any blood.  Iron supplementation has been discontinued due to elevated ferritin levels.  He feels generally well with no loss of energy, abdominal symptomatology, trouble eating or weight loss.  He drinks 2-3 beers a day, less than he did in the past.  He continues  chewing tobacco habit without any intention on cessation.  Dr. Ralene Ok and I asked the patient about his elevated blood pressure and he states "it's always that high."  He states that when his blood pressure is "really high, I have a splitting headache and I don't have a headache today."  Elevated blood pressure (199/90) was reported to [facility] at Dr. Dava Najjar office [phone].  He states that he no longer takes HCTZ and Medrol for his gout.    Past Medical History: Past Medical History  Diagnosis Date  . Kidney stones   . History of alcohol abuse   . Malignant carcinoid tumor of ileum   . Prostate cancer   . Esophageal stricture   . GERD (gastroesophageal reflux disease)   . Crohn's     Surgical History: Past Surgical History  Procedure Date  . Appendectomy   . Tonsillectomy   . Prostatectomy   . Lithotripsy   . Small intestine surgery 05/19/02    Current Medications: Current Outpatient Prescriptions  Medication Sig Dispense Refill  . acetaminophen (TYLENOL ARTHRITIS PAIN) 650 MG CR tablet Take 650 mg by mouth daily as needed.        Marland Kitchen esomeprazole (NEXIUM) 40 MG capsule Take 1 capsule (40 mg total) by mouth daily before breakfast.  90 capsule  2  . furosemide (LASIX) 20 MG tablet Take 20 mg by mouth every other day.      . labetalol (NORMODYNE) 100 MG tablet Take 100 mg by mouth 2 (two) times daily.        Marland Kitchen  lisinopril (PRINIVIL,ZESTRIL) 20 MG tablet Take 20 mg by mouth daily.      . mesalamine (APRISO) 0.375 G 24 hr capsule Take 4 tablets by mouth every morning.  360 capsule  2  . Multiple Vitamins-Minerals (CENTRUM SILVER ULTRA MENS PO) Take 1 tablet by mouth daily.        Allergies: No Known Allergies  Family History: Family History  Problem Relation Age of Onset  . Breast cancer Sister   . Colon cancer Father   . Heart disease Brother     x 2  . Heart disease Sister   . Cirrhosis Brother     alcohol releated  . Diabetes Sister     Social  History: History  Substance Use Topics  . Smoking status: Former Smoker    Types: Cigarettes    Quit date: 07/16/1970  . Smokeless tobacco: Current User    Types: Chew  . Alcohol Use: Yes     occasional beer    Review of Systems: 10 Point review of systems was completed and is negative except as noted above.   Physical Exam:   Blood pressure 199/90, pulse 63, temperature 98.4 F (36.9 C), temperature source Oral, resp. rate 20, height 6' 2"  (1.88 m), weight 187 lb (84.823 kg).  General appearance: Alert, cooperative, well nourished, no apparent distress Head: Normocephalic, without obvious abnormality, atraumatic Eyes: Conjunctivae/corneas clear, PERRLA, EOMI Nose: Nares, septum and mucosa are normal, no drainage or sinus tenderness Neck: No adenopathy, supple, symmetrical, trachea midline, thyroid not enlarged, no tenderness Resp: Clear to auscultation bilaterally Cardio: Regular rate and rhythm, S1, S2 normal, no murmur, click, rub or gallop GI: Soft, distended, non-tender, hypoactive bowel sounds, no organomegaly Lymph nodes: Cervical, supraclavicular, and axillary nodes normal Neurologic: Grossly normal   Laboratory Data: Results for orders placed in visit on 05/13/12 (from the past 48 hour(s))  CBC WITH DIFFERENTIAL     Status: Abnormal   Collection Time   05/13/12 10:40 AM      Component Value Range Comment   WBC 4.4  4.0 - 10.3 10e3/uL    NEUT# 2.9  1.5 - 6.5 10e3/uL    HGB 13.7  13.0 - 17.1 g/dL    HCT 38.3 (*) 38.4 - 49.9 %    Platelets 174  140 - 400 10e3/uL    MCV 102.7 (*) 79.3 - 98.0 fL    MCH 36.8 (*) 27.2 - 33.4 pg    MCHC 35.8  32.0 - 36.0 g/dL    RBC 3.73 (*) 4.20 - 5.82 10e6/uL    RDW 12.5  11.0 - 14.6 %    lymph# 1.2  0.9 - 3.3 10e3/uL    MONO# 0.2  0.1 - 0.9 10e3/uL    Eosinophils Absolute 0.1  0.0 - 0.5 10e3/uL    Basophils Absolute 0.0  0.0 - 0.1 10e3/uL    NEUT% 64.8  39.0 - 75.0 %    LYMPH% 27.6  14.0 - 49.0 %    MONO% 5.1  0.0 - 14.0 %     EOS% 1.5  0.0 - 7.0 %    BASO% 1.0  0.0 - 2.0 %   COMPREHENSIVE METABOLIC PANEL (XQ11)     Status: Normal   Collection Time   05/13/12 10:40 AM      Component Value Range Comment   Sodium 138  136 - 145 mEq/L    Potassium 4.1  3.5 - 5.1 mEq/L    Chloride 104  98 - 107 mEq/L  CO2 26  22 - 29 mEq/L    Glucose 84  70 - 99 mg/dl    BUN 13.0  7.0 - 26.0 mg/dL    Creatinine 1.3  0.7 - 1.3 mg/dL    Total Bilirubin 0.66  0.20 - 1.20 mg/dL    Alkaline Phosphatase 53  40 - 150 U/L    AST 28  5 - 34 U/L    ALT 28  0 - 55 U/L    Total Protein 6.8  6.4 - 8.3 g/dL    Albumin 4.1  3.5 - 5.0 g/dL    Calcium 10.0  8.4 - 10.4 mg/dL   LACTATE DEHYDROGENASE (CC13)     Status: Normal   Collection Time   05/13/12 10:40 AM      Component Value Range Comment   LDH 157  125 - 220 U/L      Imaging Studies: 1.  Two view chest x-ray from 04/27/2010 was negative. 2.  US abdomen complete on 04/24/2012 showed echogenic liver compatible with diffuse fatty infiltration.  No morphologic changes of cirrhosis identified on this study.  Mild bilateral renal atrophy.   Impression/Plan: Clinically Mr. Malenfant continues to do well. He is without any complaints today. He is almost 10 years from his carcinoid tumor and almost 9 years from his prostate cancer without evidence of recurrence.  He does have Crohn's disease.  Mr. Daughdrill was made aware of his elevated blood pressure today. As stated, elevated blood pressure was reported to [facility] at Dr. Dava Najjar office.  We will plan to see Mr. Frankenfield again in 6 months (11/11/2012), at which time we will check CBC, chemistries, iron studies, PSA and vitamin B12 level.  Mr. Chestnut was asked to contact us in the interim if he has any questions or concerns.   Ailene Ards, NP-C 05/13/2012, 10:45 AM

## 2012-05-13 NOTE — Patient Instructions (Addendum)
Check your blood pressure often. Report your elevated blood pressure (199/90) to your kidney doctor today.

## 2012-05-13 NOTE — Telephone Encounter (Signed)
Printed and gv pt appt schedule for April 2014

## 2012-11-04 ENCOUNTER — Encounter: Payer: Self-pay | Admitting: Internal Medicine

## 2012-11-10 ENCOUNTER — Telehealth: Payer: Self-pay | Admitting: Oncology

## 2012-11-10 NOTE — Telephone Encounter (Signed)
Pt called today and moved 4/29 appt to 6/26. Desk nurse aware.

## 2012-11-11 ENCOUNTER — Other Ambulatory Visit: Payer: Medicare Other | Admitting: Lab

## 2012-11-11 ENCOUNTER — Ambulatory Visit: Payer: Medicare Other | Admitting: Oncology

## 2013-01-08 ENCOUNTER — Telehealth: Payer: Self-pay | Admitting: Oncology

## 2013-01-08 ENCOUNTER — Other Ambulatory Visit (HOSPITAL_BASED_OUTPATIENT_CLINIC_OR_DEPARTMENT_OTHER): Payer: Medicare Other | Admitting: Lab

## 2013-01-08 ENCOUNTER — Encounter: Payer: Self-pay | Admitting: Oncology

## 2013-01-08 ENCOUNTER — Ambulatory Visit (HOSPITAL_BASED_OUTPATIENT_CLINIC_OR_DEPARTMENT_OTHER): Payer: Medicare Other | Admitting: Oncology

## 2013-01-08 VITALS — BP 173/74 | HR 67 | Temp 97.0°F | Resp 18 | Ht 74.0 in | Wt 188.2 lb

## 2013-01-08 DIAGNOSIS — Z8546 Personal history of malignant neoplasm of prostate: Secondary | ICD-10-CM

## 2013-01-08 DIAGNOSIS — C7A012 Malignant carcinoid tumor of the ileum: Secondary | ICD-10-CM

## 2013-01-08 DIAGNOSIS — D539 Nutritional anemia, unspecified: Secondary | ICD-10-CM

## 2013-01-08 DIAGNOSIS — R7989 Other specified abnormal findings of blood chemistry: Secondary | ICD-10-CM

## 2013-01-08 LAB — CBC WITH DIFFERENTIAL/PLATELET
Eosinophils Absolute: 0.2 10*3/uL (ref 0.0–0.5)
HCT: 34.8 % — ABNORMAL LOW (ref 38.4–49.9)
HGB: 11.9 g/dL — ABNORMAL LOW (ref 13.0–17.1)
LYMPH%: 22.8 % (ref 14.0–49.0)
MONO#: 0.3 10*3/uL (ref 0.1–0.9)
NEUT#: 3.4 10*3/uL (ref 1.5–6.5)
NEUT%: 67.6 % (ref 39.0–75.0)
Platelets: 165 10*3/uL (ref 140–400)
WBC: 5 10*3/uL (ref 4.0–10.3)

## 2013-01-08 LAB — COMPREHENSIVE METABOLIC PANEL (CC13)
AST: 23 U/L (ref 5–34)
Alkaline Phosphatase: 57 U/L (ref 40–150)
BUN: 12.2 mg/dL (ref 7.0–26.0)
Calcium: 9.3 mg/dL (ref 8.4–10.4)
Chloride: 108 mEq/L (ref 98–109)
Creatinine: 1.4 mg/dL — ABNORMAL HIGH (ref 0.7–1.3)
Glucose: 85 mg/dl (ref 70–140)

## 2013-01-08 LAB — IRON AND TIBC
%SAT: 48 % (ref 20–55)
Iron: 112 ug/dL (ref 42–165)
UIBC: 119 ug/dL — ABNORMAL LOW (ref 125–400)

## 2013-01-08 LAB — PSA: PSA: 0.01 ng/mL (ref <=4.00)

## 2013-01-08 LAB — VITAMIN B12: Vitamin B-12: 371 pg/mL (ref 211–911)

## 2013-01-08 NOTE — Telephone Encounter (Signed)
Gave pt appt for December 2014 lab and MD

## 2013-01-08 NOTE — Progress Notes (Signed)
This office note has been dictated.  #[id number]

## 2013-01-09 ENCOUNTER — Encounter: Payer: Self-pay | Admitting: Oncology

## 2013-01-09 ENCOUNTER — Telehealth: Payer: Self-pay

## 2013-01-09 ENCOUNTER — Other Ambulatory Visit: Payer: Self-pay | Admitting: Oncology

## 2013-01-09 DIAGNOSIS — R7989 Other specified abnormal findings of blood chemistry: Secondary | ICD-10-CM

## 2013-01-09 NOTE — Telephone Encounter (Signed)
lvm that labs were mostly normal except ferritin which Dr Ralene Ok discussed w/pt yesterday. Will mail the labs. Also Dr Ralene Ok wants pt to get CXR.

## 2013-01-09 NOTE — Progress Notes (Signed)
CC:   Ronald [name]. Ronald Perches, MD Ronald Penna, MD Ronald Aguas, MD  PROBLEM LIST:  1. 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 The patient underwent  small bowel resection on 05/19/2002.  2. Adenocarcinoma of the prostate, status post surgery on 06/30/2003.  3. Renal insufficiency.  4. Anemia with elevated red cell indices.  5. History of Crohn's disease dating back to 10/2001.  6. COPD.  7. History of kidney stones.  8. Hypertension.  9. Dyslipidemia.  10. Gout diagnosed 10/2011. Symptoms affect the left foot, being  treated with Medrol.  11. History of alcohol use. 12. Elevated ferritin 1960 on 11/12/2011.  Iron saturation was 41%.   MEDICATIONS:  Reviewed and recorded. Current Outpatient Prescriptions  Medication Sig Dispense Refill  . acetaminophen (TYLENOL ARTHRITIS PAIN) 650 MG CR tablet Take 650 mg by mouth daily as needed.        Marland Kitchen allopurinol (ZYLOPRIM) 300 MG tablet Take 150 mg by mouth daily.      Marland Kitchen aspirin 81 MG tablet Take 81 mg by mouth daily.      Marland Kitchen atorvastatin (LIPITOR) 80 MG tablet Take 80 mg by mouth at bedtime.      Marland Kitchen esomeprazole (NEXIUM) 40 MG capsule Take 1 capsule (40 mg total) by mouth daily before breakfast.  90 capsule  2  . labetalol (NORMODYNE) 100 MG tablet Take 100 mg by mouth 2 (two) times daily.        Marland Kitchen lisinopril (PRINIVIL,ZESTRIL) 20 MG tablet Take 20 mg by mouth 2 (two) times daily.       . mesalamine (APRISO) 0.375 G 24 hr capsule Take 4 tablets by mouth every morning.  360 capsule  2  . Multiple Vitamins-Minerals (CENTRUM SILVER ULTRA MENS PO) Take 1 tablet by mouth daily.      . Nutritional Supplements (SODIUM BENZOATE PO) Take 2 tablets by mouth daily.      Marland Kitchen spironolactone (ALDACTONE) 25 MG tablet Take 12.5 mg by mouth daily.       No current facility-administered medications for this visit.    SMOKING HISTORY:  The patient smoked less than a pack of cigarettes a day for  20 years.  He stopped smoking about 40 years ago.    HISTORY:  Ronald [name] was seen today for followup of his carcinoid tumor involving the mid ileum dating back to November 2003, T3 N1, stage III.  Ronald [name] also has undergone surgery for prostate cancer at [facility] on 06/30/2003.  He is without symptoms relating to either of these cancers.  He denies any symptoms to suggest carcinoid syndrome.  No diarrhea or flushing.  He denies any difficulty with urination, other than nocturia.  The patient remains active despite his age of 66.  He lives in a mobile home near [place].  The patient tells me that he was hospitalized at [facility] back in February 2014 because of elevated blood pressures.  Two months ago, he was placed on spironolactone by his internist.  He has noticed some sensitivity of his breasts since that time.  PHYSICAL EXAMINATION:  The patient looks well and is in good spirits. Weight is 188 pounds 3.2 ounces, height 62 inches, body surface area 2.11 sq m.  Blood pressure 173/74 in the right arm.  Other vital signs are normal.  There is no scleral icterus.  Mouth and pharynx are benign. He has dentures.  There is  no peripheral adenopathy palpable.  Lungs clear; however, there was a suggestion of some decreased breath sounds and some dullness over the right lung.  Cardiac:  Regular rhythm without murmur or rub.  There may be some very slight gynecomastia on the right. The patient's nipples were somewhat sensitive, but I did not see any lesions.  Abdomen:  Benign with no organomegaly or masses palpable.  The patient is without any abdominal symptoms.  Extremities:  No peripheral edema or clubbing.  Neurologic exam was normal.  LABORATORY DATA:  Today, white count 5.0, ANC 3.4, hemoglobin 11.9, hematocrit 34.8, platelets 165,000.  Chemistries today were notable for a BUN of 12, creatinine 1.4, albumin 3.9, LDH 177.  Pending are iron studies,  PSA, and a vitamin B12 level.  Vitamin B12 level on 05/13/2012 was 306.  On 11/12/2011, ferritin was 1960 with an iron saturation of 41%.  Vitamin B12 level was 317.  PSA on 05/13/2012 was less than 0.01.   IMAGING STUDIES:  1. Two view chest x-ray from 04/27/2010 was negative. 2. Ultrasound of the abdomen complete on 04/24/2012, showed echogenic liver compatible with diffuse fatty infiltration.  There were no morphologic changes of cirrhosis.  There was mild bilateral renal atrophy.    IMPRESSION AND PLAN:  Ronald [name] continues to do well.  We had advised him to stop taking supplemental iron back when we receive the elevated ferritin level of 1960 back over a year ago.  The patient tells me that he is taking vitamin pills, but they may also be containing iron.  We are checking another set of iron studies today.  If the iron studies come back elevated, then the next step would be for the patient to undergo genetic testing for hemochromatosis.  I have explained this to him.  The patient is having some breast sensitivity.  This may be due to the spironolactone that was started a couple of months ago.  Today, the patient had what I thought were decreased breath sounds and some dullness over the right lung.  He was not having any symptoms.  His last chest x-ray was back on 04/27/2010.  The patient had been a mild smoker.  We may want to check a chest x-ray.  At this point, there does not appear to be any evidence for recurrence of his carcinoid tumor or his adenocarcinoma of the prostate, now more than 10 years for the carcinoid tumor and approaching 10 years on the prostate cancer.  FOLLOWUP:  We will plan to have Ronald [name] return in 6 months at which time we will check CBC, chemistries, iron studies, and a PSA.  As stated, we are awaiting the results of today's iron studies.  We may check for hemochromatosis.  We will also plan to get a chest  x-ray.    ______________________________ Jeanie Cooks, M.D. DSM/MEDQ  D:  01/08/2013  T:  01/09/2013  Job:  [id number]

## 2013-01-09 NOTE — Progress Notes (Signed)
Ferritin was 1881 and iron saturation 48% on 01/08/2013.  We are ordering the genetic tests for hemochromatosis.  The patient also is scheduled for chest x-ray.

## 2013-01-13 ENCOUNTER — Ambulatory Visit (HOSPITAL_COMMUNITY)
Admission: RE | Admit: 2013-01-13 | Discharge: 2013-01-13 | Disposition: A | Discharge status: Home / Self Care | Payer: Medicare Other | Source: Ambulatory Visit | Attending: Oncology | Admitting: Oncology

## 2013-01-13 DIAGNOSIS — C7A012 Malignant carcinoid tumor of the ileum: Secondary | ICD-10-CM

## 2013-01-13 DIAGNOSIS — Z8546 Personal history of malignant neoplasm of prostate: Secondary | ICD-10-CM

## 2013-01-13 NOTE — Progress Notes (Signed)
Quick Note:  Please notify patient and call/fax these results to patient's doctors. ______

## 2013-01-14 ENCOUNTER — Telehealth: Payer: Self-pay | Admitting: Medical Oncology

## 2013-01-14 NOTE — Telephone Encounter (Signed)
I called pt with chest xray results.

## 2013-02-10 ENCOUNTER — Encounter: Payer: Self-pay | Admitting: Internal Medicine

## 2013-02-10 MED ORDER — MESALAMINE ER 0.375 G PO CP24: ORAL_CAPSULE | ORAL | Status: DC

## 2013-02-10 NOTE — Telephone Encounter (Signed)
Error

## 2013-05-14 ENCOUNTER — Telehealth: Payer: Self-pay | Admitting: Internal Medicine

## 2013-05-14 MED ORDER — MESALAMINE ER 0.375 G PO CP24: ORAL_CAPSULE | ORAL | Status: DC

## 2013-05-14 NOTE — Telephone Encounter (Signed)
rx sent

## 2013-06-30 ENCOUNTER — Ambulatory Visit (INDEPENDENT_AMBULATORY_CARE_PROVIDER_SITE_OTHER): Payer: Medicare Other | Admitting: Internal Medicine

## 2013-06-30 ENCOUNTER — Encounter: Payer: Self-pay | Admitting: Internal Medicine

## 2013-06-30 VITALS — BP 170/90 | HR 68 | Ht 71.25 in | Wt 192.1 lb

## 2013-06-30 DIAGNOSIS — K5000 Crohn's disease of small intestine without complications: Secondary | ICD-10-CM

## 2013-06-30 DIAGNOSIS — K219 Gastro-esophageal reflux disease without esophagitis: Secondary | ICD-10-CM

## 2013-06-30 DIAGNOSIS — D490 Neoplasm of unspecified behavior of digestive system: Secondary | ICD-10-CM

## 2013-06-30 DIAGNOSIS — D3A098 Benign carcinoid tumors of other sites: Secondary | ICD-10-CM

## 2013-06-30 MED ORDER — MESALAMINE ER 0.375 G PO CP24: ORAL_CAPSULE | ORAL | Status: DC

## 2013-06-30 MED ORDER — ESOMEPRAZOLE MAGNESIUM 40 MG PO CPDR: 40.0000 mg | DELAYED_RELEASE_CAPSULE | Freq: Every day | ORAL | Status: DC

## 2013-06-30 NOTE — Progress Notes (Signed)
Ronald [name] 1933/09/10 [id number]   History of Present Illness:  This is a 79 year old white male with Crohn's disease of the terminal ileum, positive IBD markers and a history of a malignant carcinoid resected in November 2003. He also has a family history of colon cancer in his father. His last upper endoscopy for esophageal stricture was in 2009 when he was also dilated. His last colonoscopy was in May 2009. He has been followed by Dr.Murinson. His last appointment was in June 2014. His next appointment with him is later this month. He is also followed by Dr. Dimas Aguas, nephrologist. He had blood test 2 weeks ago. 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 He denies diarrhea, abdominal pain or weight changes.  Past Medical History  Diagnosis Date  . Kidney stones   . History of alcohol abuse   . Malignant carcinoid tumor of ileum   . Prostate cancer   . Esophageal stricture   . GERD (gastroesophageal reflux disease)   . Crohn's     Past Surgical History  Procedure Laterality Date  . Appendectomy    . Tonsillectomy    . Prostatectomy    . Lithotripsy    . Small intestine surgery  05/19/02    No Known Allergies  Review of Systems: Negative for dysphagia abdominal pain or diarrhea  The remainder of the 10 point ROS is negative except as outlined in the H&P  Physical Exam: General Appearance Well developed, in no distress Eyes  Non icteric  HEENT  Non traumatic, normocephalic  Mouth No lesion, tongue papillated, no cheilosis Neck Supple without adenopathy, thyroid not enlarged, no carotid bruits, no JVD Lungs Clear to auscultation bilaterally COR Normal S1, normal S2, regular rhythm, no murmur, quiet precordium Abdomen Soft, nontender with normoactive bowel sounds Rectal soft Hemoccult negative stool Extremities  No pedal edema Skin No lesions Neurological Alert and oriented x 3 Psychological Normal mood and affect  Assessment and Plan:   Problem #1  Crohn's disease of the terminal ileum in remission. We will refill his mesalamine 1.5 g daily. He is due for a recall colonoscopy but declines.  Problem #2 Family history of colon cancer in patient's father. Patient declines followup colonoscopy.  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 This was resected in October 2003. One out of 6 nodes was positive. The tumor measured 1.5 cm and it was stage III. He is followed by Dr.Murinson.  Problem #4 Gastroesophageal reflux with a history of esophageal stricture. He is currently asymptomatic on Nexium 40 mg daily. We will refill a 90 day supply.  Patient has recent lab tests done by his nephrologist.   [name] 06/30/2013

## 2013-06-30 NOTE — Patient Instructions (Addendum)
You have been given a separate informational sheet regarding your tobacco use, the importance of quitting and local resources to help you quit.  We have sent the following medications to your pharmacy for you to pick up at your convenience: Nexium Apriso  Please follow up with Dr [name] in 1 year.  Dr Smith,[facility] [place], Dr Iqwemezie,Dr Ralene Ok

## 2013-07-10 ENCOUNTER — Other Ambulatory Visit: Payer: Self-pay

## 2013-07-10 DIAGNOSIS — Z8546 Personal history of malignant neoplasm of prostate: Secondary | ICD-10-CM

## 2013-07-10 DIAGNOSIS — C7A012 Malignant carcinoid tumor of the ileum: Secondary | ICD-10-CM

## 2013-07-10 DIAGNOSIS — R7989 Other specified abnormal findings of blood chemistry: Secondary | ICD-10-CM

## 2013-07-13 ENCOUNTER — Telehealth: Payer: Self-pay | Admitting: Internal Medicine

## 2013-07-13 ENCOUNTER — Ambulatory Visit (HOSPITAL_BASED_OUTPATIENT_CLINIC_OR_DEPARTMENT_OTHER): Payer: Medicare Other | Admitting: Internal Medicine

## 2013-07-13 ENCOUNTER — Other Ambulatory Visit (HOSPITAL_BASED_OUTPATIENT_CLINIC_OR_DEPARTMENT_OTHER): Payer: Medicare Other

## 2013-07-13 VITALS — BP 183/87 | HR 66 | Temp 97.3°F | Resp 18 | Ht 71.25 in | Wt 194.9 lb

## 2013-07-13 DIAGNOSIS — Z8546 Personal history of malignant neoplasm of prostate: Secondary | ICD-10-CM

## 2013-07-13 DIAGNOSIS — C7A012 Malignant carcinoid tumor of the ileum: Secondary | ICD-10-CM

## 2013-07-13 DIAGNOSIS — D539 Nutritional anemia, unspecified: Secondary | ICD-10-CM

## 2013-07-13 DIAGNOSIS — R7989 Other specified abnormal findings of blood chemistry: Secondary | ICD-10-CM

## 2013-07-13 DIAGNOSIS — Z8719 Personal history of other diseases of the digestive system: Secondary | ICD-10-CM

## 2013-07-13 DIAGNOSIS — R945 Abnormal results of liver function studies: Secondary | ICD-10-CM

## 2013-07-13 LAB — CBC WITH DIFFERENTIAL/PLATELET
BASO%: 1.2 % (ref 0.0–2.0)
Basophils Absolute: 0.1 10*3/uL (ref 0.0–0.1)
Eosinophils Absolute: 0.1 10*3/uL (ref 0.0–0.5)
HGB: 12.6 g/dL — ABNORMAL LOW (ref 13.0–17.1)
LYMPH%: 28.1 % (ref 14.0–49.0)
MCHC: 34.9 g/dL (ref 32.0–36.0)
MONO#: 0.3 10*3/uL (ref 0.1–0.9)
MONO%: 5.1 % (ref 0.0–14.0)
Platelets: 197 10*3/uL (ref 140–400)
RBC: 3.5 10*6/uL — ABNORMAL LOW (ref 4.20–5.82)
lymph#: 1.9 10*3/uL (ref 0.9–3.3)

## 2013-07-13 LAB — PSA: PSA: 0.16 ng/mL (ref <=4.00)

## 2013-07-13 LAB — IRON AND TIBC CHCC: UIBC: 124 ug/dL (ref 117–376)

## 2013-07-13 LAB — COMPREHENSIVE METABOLIC PANEL (CC13)
ALT: 40 U/L (ref 0–55)
Albumin: 4.1 g/dL (ref 3.5–5.0)
Alkaline Phosphatase: 60 U/L (ref 40–150)
Anion Gap: 12 mEq/L — ABNORMAL HIGH (ref 3–11)
CO2: 22 mEq/L (ref 22–29)
Calcium: 9.3 mg/dL (ref 8.4–10.4)
Chloride: 106 mEq/L (ref 98–109)
Glucose: 90 mg/dl (ref 70–140)
Sodium: 139 mEq/L (ref 136–145)
Total Protein: 7.5 g/dL (ref 6.4–8.3)

## 2013-07-13 LAB — LACTATE DEHYDROGENASE (CC13): LDH: 163 U/L (ref 125–245)

## 2013-07-13 NOTE — Progress Notes (Signed)
[place] OFFICE PROGRESS NOTE  No primary provider on file. No primary provider on file.  DIAGNOSIS: PROSTATE CANCER, HX OF - Plan: CBC with Differential, Comprehensive metabolic panel (Cmet) - [facility], PSA, PSA  Malignant carcinoid tumor of the ileum  SMALL BOWEL OBSTRUCTION, HX OF  Abnormal liver function tests  Unspecified deficiency anemia  Other hemochromatosis - Plan: Ferritin, Iron and TIBC  Chief Complaint  Patient presents with  . PROSTATE CANCER, HX OF    CURRENT THERAPY: Observation.   INTERVAL HISTORY: Ronald [name] 79 y.o. 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  He was last seen by Dr. Santiago Bur on 01/08/2013.    He is followed by Dr. [name] of GI for his crohn's disease of the terminal ileum in remission. Last visit on 06/30/2013, he was provided his mesalamine 1.5 g daily and was due for a recall colonoscopy but declined. He is also followed by Dr. Dimas Aguas, nephrologist. He had blood test 2 weeks ago. 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 He denies diarrhea, abdominal pain or weight changes. He is without symptoms relating to either of these cancers. He denies any symptoms to suggest carcinoid syndrome. No diarrhea or flushing. He denies any difficulty with urination, other than nocturia.   MEDICAL HISTORY: Past Medical History  Diagnosis Date  . Kidney stones   . History of alcohol abuse   . Malignant carcinoid tumor of ileum   . Prostate cancer   . Esophageal stricture   . GERD (gastroesophageal reflux disease)   . Crohn's     INTERIM HISTORY: has MALIGNANT CARCINOID TUMOR OF THE ILEUM; ESOPHAGEAL STRICTURE; GERD; CROHN'S DISEASE; PROSTATE CANCER, HX OF; ALCOHOL ABUSE, HX OF; SMALL BOWEL OBSTRUCTION, HX OF; Abnormal liver function tests; Unspecified deficiency anemia; and Elevated ferritin on his problem list.    ALLERGIES:   has No Known Allergies.  MEDICATIONS: has a current medication list which includes the following prescription(s): acetaminophen, allopurinol, aspirin, atorvastatin, esomeprazole, labetalol, lisinopril, mesalamine, multiple vitamins-minerals, and triamterene-hydrochlorothiazide.  SURGICAL HISTORY:  Past Surgical History  Procedure Laterality Date  . Appendectomy    . Tonsillectomy    . Prostatectomy    . Lithotripsy    . Small intestine surgery  05/19/02   PROBLEM LIST:  1. 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 The patient underwent  small bowel resection on 05/19/2002.  2. Adenocarcinoma of the prostate, status post surgery on 06/30/2003.  3. Renal insufficiency.  4. Anemia with elevated red cell indices.  5. History of Crohn's disease dating back to 10/2001.  6. COPD.  7. History of kidney stones.  8. Hypertension.  9. Dyslipidemia.  10. Gout diagnosed 10/2011. Symptoms affect the left foot, being  treated with Medrol.  11. History of alcohol use.  12. Elevated ferritin 1960 on 11/12/2011. Iron saturation was 41%.  REVIEW OF SYSTEMS:   Constitutional: Denies fevers, chills or abnormal weight loss Eyes: Denies blurriness of vision Ears, nose, mouth, throat, and face: Denies mucositis or sore throat Respiratory: Denies cough, dyspnea or wheezes Cardiovascular: Denies palpitation, chest discomfort or lower extremity swelling Gastrointestinal:  Denies nausea, heartburn or change in bowel habits Skin: Denies abnormal skin rashes Lymphatics: Denies new lymphadenopathy or easy bruising Neurological:Denies numbness, tingling or new weaknesses Behavioral/Psych: Mood is stable, no new changes  All other systems were reviewed with the patient  and are negative.  PHYSICAL EXAMINATION: ECOG PERFORMANCE STATUS: 1 - Symptomatic but completely ambulatory  Blood pressure 183/87, pulse 66, temperature 97.3 F (36.3 C),  temperature source Oral, resp. rate 18, height 5' 11.25" (1.81 m), weight 194 lb 14.4 oz (88.406 kg).  GENERAL:alert, no distress and comfortable SKIN: skin color, texture, turgor are normal, no rashes or significant lesions EYES: normal, Conjunctiva are pink and non-injected, sclera clear OROPHARYNX:no exudate, no erythema and lips, buccal mucosa, and tongue normal  NECK: supple, thyroid normal size, non-tender, without nodularity LYMPH:  no palpable lymphadenopathy in the cervical, axillary or supraclavicular LUNGS: clear to auscultation and percussion with normal breathing effort HEART: regular rate & rhythm and no murmurs and no lower extremity edema ABDOMEN:abdomen soft, non-tender and normal bowel sounds Musculoskeletal:no cyanosis of digits and no clubbing  NEURO: alert & oriented x 3 with fluent speech, no focal motor/sensory deficits   LABORATORY DATA: Results for orders placed in visit on 07/13/13 (from the past 48 hour(s))  CBC WITH DIFFERENTIAL     Status: Abnormal   Collection Time    07/13/13 12:51 PM      Result Value Range   WBC 6.7  4.0 - 10.3 10e3/uL   NEUT# 4.3  1.5 - 6.5 10e3/uL   HGB 12.6 (*) 13.0 - 17.1 g/dL   HCT 36.1 (*) 38.4 - 49.9 %   Platelets 197  140 - 400 10e3/uL   MCV 103.1 (*) 79.3 - 98.0 fL   MCH 36.0 (*) 27.2 - 33.4 pg   MCHC 34.9  32.0 - 36.0 g/dL   RBC 3.50 (*) 4.20 - 5.82 10e6/uL   RDW 13.1  11.0 - 14.6 %   lymph# 1.9  0.9 - 3.3 10e3/uL   MONO# 0.3  0.1 - 0.9 10e3/uL   Eosinophils Absolute 0.1  0.0 - 0.5 10e3/uL   Basophils Absolute 0.1  0.0 - 0.1 10e3/uL   NEUT% 64.2  39.0 - 75.0 %   LYMPH% 28.1  14.0 - 49.0 %   MONO% 5.1  0.0 - 14.0 %   EOS% 1.4  0.0 - 7.0 %   BASO% 1.2  0.0 - 2.0 %  FERRITIN [facility]     Status: Abnormal   Collection Time    07/13/13 12:51 PM      Result Value Range   Ferritin 2,161 Result Confirmed by Automated Dilution. (*) 22 - 316 ng/ml  IRON AND TIBC [facility]     Status: None   Collection Time    07/13/13 12:51 PM       Result Value Range   Iron 136  42 - 163 ug/dL   TIBC 260  202 - 409 ug/dL   UIBC 124  117 - 376 ug/dL   %SAT 52  20 - 55 %  COMPREHENSIVE METABOLIC PANEL (PV37)     Status: Abnormal   Collection Time    07/13/13 12:51 PM      Result Value Range   Sodium 139  136 - 145 mEq/L   Potassium 4.2  3.5 - 5.1 mEq/L   Chloride 106  98 - 109 mEq/L   CO2 22  22 - 29 mEq/L   Glucose 90  70 - 140 mg/dl   BUN 15.7  7.0 - 26.0 mg/dL   Creatinine 1.4 (*) 0.7 - 1.3 mg/dL   Total Bilirubin 0.71  0.20 - 1.20 mg/dL   Alkaline Phosphatase 60  40 - 150 U/L   AST 33  5 - 34 U/L  ALT 40  0 - 55 U/L   Total Protein 7.5  6.4 - 8.3 g/dL   Albumin 4.1  3.5 - 5.0 g/dL   Calcium 9.3  8.4 - 10.4 mg/dL   Anion Gap 12 (*) 3 - 11 mEq/L  PSA     Status: None   Collection Time    07/13/13 12:51 PM      Result Value Range   PSA 0.16  <=4.00 ng/mL   Comment: Test Methodology: ECLIA PSA (Electrochemiluminescence Immunoassay) For PSA values from 2.5-4.0, particularly in younger men <72 yearsold, the AUA and NCCN suggest testing for % Free PSA (3515) andevaluation of the rate of increase in PSA (PSA velocity).  LACTATE DEHYDROGENASE (CC13)     Status: None   Collection Time    07/13/13 12:51 PM      Result Value Range   LDH 163  125 - 245 U/L    Labs:  Lab Results  Component Value Date   WBC 6.7 07/13/2013   HGB 12.6* 07/13/2013   HCT 36.1* 07/13/2013   MCV 103.1* 07/13/2013   PLT 197 07/13/2013   NEUTROABS 4.3 07/13/2013      Chemistry      Component Value Date/Time   NA 139 07/13/2013 1251   NA 141 11/12/2011 1321   K 4.2 07/13/2013 1251   K 4.0 11/12/2011 1321   CL 104 05/13/2012 1040   CL 104 11/12/2011 1321   CO2 22 07/13/2013 1251   CO2 22 11/12/2011 1321   BUN 15.7 07/13/2013 1251   BUN 17 11/12/2011 1321   CREATININE 1.4* 07/13/2013 1251   CREATININE 1.36* 11/12/2011 1321   CREATININE 2.1* 10/22/2005 1102      Component Value Date/Time   CALCIUM 9.3 07/13/2013 1251   CALCIUM 9.2 11/12/2011  1321   ALKPHOS 60 07/13/2013 1251   ALKPHOS 43 11/12/2011 1321   AST 33 07/13/2013 1251   AST 20 11/12/2011 1321   ALT 40 07/13/2013 1251   ALT 23 11/12/2011 1321   BILITOT 0.71 07/13/2013 1251   BILITOT 0.3 11/12/2011 1321      Basic Metabolic Panel:  Recent Labs Lab 07/13/13 1251  NA 139  K 4.2  CO2 22  GLUCOSE 90  BUN 15.7  CREATININE 1.4*  CALCIUM 9.3   GFR Estimated Creatinine Clearance: 47.4 ml/min (by C-G formula based on Cr of 1.4). Liver Function Tests:  Recent Labs Lab 07/13/13 1251  AST 33  ALT 40  ALKPHOS 60  BILITOT 0.71  PROT 7.5  ALBUMIN 4.1   CBC:  Recent Labs Lab 07/13/13 1251  WBC 6.7  NEUTROABS 4.3  HGB 12.6*  HCT 36.1*  MCV 103.1*  PLT 197   Iron/TIBC/Ferritin    Component Value Date/Time   IRON 136 07/13/2013 1251   IRON 112 01/08/2013 1136   TIBC 260 07/13/2013 1251   TIBC 231 01/08/2013 1136   FERRITIN 2,161 Result Confirmed by Automated Dilution.* 07/13/2013 1251   FERRITIN 1881* 01/08/2013 1136   Results for SHERI, PROWS (MRN [id number]) as of 07/14/2013 05:41  Ref. Range 05/13/2012 10:40 01/08/2013 11:36 07/13/2013 12:51  PSA Latest Range: <=4.00 ng/mL <0.01 0.01 0.16   Studies:  No results found.   RADIOGRAPHIC STUDIES: No results found.  ASSESSMENT: Ronald [name] 79 y.o. male with a history of PROSTATE CANCER, HX OF - Plan: CBC with Differential, Comprehensive metabolic panel (Cmet) - [facility], PSA, PSA  Malignant carcinoid tumor of the ileum  SMALL BOWEL OBSTRUCTION, HX OF  Abnormal liver function tests  Unspecified deficiency anemia  Other hemochromatosis - Plan: Ferritin, Iron and TIBC   PLAN:   1. Prostate Cancer. --Ronald [name] continues to do well. At this point, there does not appear to be any evidence for recurrence of his adenocarcinoma of the prostate approaching 10 years from diagnosis.  Status post surgery on 06/30/2003 He denies any gu symptoms presently.  His PSA rose to 0.16 today.  We will  repeat this lab in 3 months and persistent increase in doubling time, he will be referred to urology or radiation oncology.   2. Carcinoid tumor.  --At this point, there does not appear to be any evidence for recurrence of his carcinoid tumor now more than 10 years for the carcinoid tumor. He denies any symptoms of diarrhea or flushing.   3. Questionable secondary hemochromatosis.  -- We will check iron studies and ferritin.  Ferritin remains elevated. He has normal liver function tests. He was advised to stop taking supplemental iron back when his ferritin level was elevated to 1960 back over a year ago.  If the iron studies continue to be elevated, then the next step would be for the patient to undergo genetic testing for hemochromatosis.  4. Crohn's disease --Management per GI (Dr. [name])  5. Follow-up. -- We will plan to have Ronald [name] return in 6 months at which  time we will check CBC, chemistries, iron studies, and a PSA.   All questions were answered. The patient knows to call the clinic with any problems, questions or concerns. We can certainly see the patient much sooner if necessary.  I spent 15 minutes counseling the patient face to face. The total time spent in the appointment was 25 minutes.    Stalin Gruenberg, MD 07/14/2013 5:46 AM

## 2013-07-13 NOTE — Telephone Encounter (Signed)
gv and printed appt sch3ed and avs for pt for June 2015

## 2013-07-13 NOTE — Telephone Encounter (Signed)
gv and printed appt sched and avs for pt for June 2015

## 2013-07-14 ENCOUNTER — Telehealth: Payer: Self-pay | Admitting: Internal Medicine

## 2013-07-14 NOTE — Telephone Encounter (Signed)
lvm for March 2015 appt

## 2013-08-22 ENCOUNTER — Ambulatory Visit (HOSPITAL_COMMUNITY)
Admission: EM | Admit: 2013-08-22 | Discharge: 2013-08-22 | Disposition: A | Discharge status: Home / Self Care | Payer: Medicare Other | Source: Ambulatory Visit | Attending: Internal Medicine | Admitting: Internal Medicine

## 2013-08-22 ENCOUNTER — Encounter (HOSPITAL_COMMUNITY): Payer: Self-pay

## 2013-08-22 ENCOUNTER — Encounter (HOSPITAL_COMMUNITY)
Admission: EM | Disposition: A | Discharge status: Home / Self Care | Payer: Self-pay | Source: Ambulatory Visit | Attending: Internal Medicine

## 2013-08-22 DIAGNOSIS — T18108A Unspecified foreign body in esophagus causing other injury, initial encounter: Secondary | ICD-10-CM

## 2013-08-22 DIAGNOSIS — K222 Esophageal obstruction: Secondary | ICD-10-CM | POA: Insufficient documentation

## 2013-08-22 DIAGNOSIS — I10 Essential (primary) hypertension: Secondary | ICD-10-CM | POA: Insufficient documentation

## 2013-08-22 DIAGNOSIS — C61 Malignant neoplasm of prostate: Secondary | ICD-10-CM | POA: Insufficient documentation

## 2013-08-22 DIAGNOSIS — Z9089 Acquired absence of other organs: Secondary | ICD-10-CM | POA: Insufficient documentation

## 2013-08-22 DIAGNOSIS — T18128A Food in esophagus causing other injury, initial encounter: Secondary | ICD-10-CM | POA: Diagnosis present

## 2013-08-22 DIAGNOSIS — K259 Gastric ulcer, unspecified as acute or chronic, without hemorrhage or perforation: Secondary | ICD-10-CM | POA: Insufficient documentation

## 2013-08-22 DIAGNOSIS — K2900 Acute gastritis without bleeding: Secondary | ICD-10-CM | POA: Insufficient documentation

## 2013-08-22 DIAGNOSIS — W44F3XA Food entering into or through a natural orifice, initial encounter: Secondary | ICD-10-CM | POA: Diagnosis present

## 2013-08-22 DIAGNOSIS — Z859 Personal history of malignant neoplasm, unspecified: Secondary | ICD-10-CM | POA: Insufficient documentation

## 2013-08-22 DIAGNOSIS — K5090 Crohn's disease, unspecified, without complications: Secondary | ICD-10-CM | POA: Insufficient documentation

## 2013-08-22 DIAGNOSIS — Z79899 Other long term (current) drug therapy: Secondary | ICD-10-CM | POA: Insufficient documentation

## 2013-08-22 DIAGNOSIS — Z87891 Personal history of nicotine dependence: Secondary | ICD-10-CM | POA: Insufficient documentation

## 2013-08-22 DIAGNOSIS — Z9079 Acquired absence of other genital organ(s): Secondary | ICD-10-CM | POA: Insufficient documentation

## 2013-08-22 DIAGNOSIS — K219 Gastro-esophageal reflux disease without esophagitis: Secondary | ICD-10-CM | POA: Insufficient documentation

## 2013-08-22 HISTORY — PX: ESOPHAGOGASTRODUODENOSCOPY: SHX5428

## 2013-08-22 SURGERY — EGD (ESOPHAGOGASTRODUODENOSCOPY)
Anesthesia: Moderate Sedation

## 2013-08-22 MED ORDER — FENTANYL CITRATE 0.05 MG/ML IJ SOLN
INTRAMUSCULAR | Status: DC | PRN
  Administered 2013-08-22 (×3): 25 ug via INTRAVENOUS

## 2013-08-22 MED ORDER — BUTAMBEN-TETRACAINE-BENZOCAINE 2-2-14 % EX AERO
INHALATION_SPRAY | CUTANEOUS | Status: DC | PRN
  Administered 2013-08-22: 1 via TOPICAL

## 2013-08-22 MED ORDER — SODIUM CHLORIDE 0.9 % IV SOLN
INTRAVENOUS | Status: DC
  Administered 2013-08-22: 500 mL via INTRAVENOUS

## 2013-08-22 MED ORDER — ESOMEPRAZOLE MAGNESIUM 40 MG PO CPDR: 40.0000 mg | DELAYED_RELEASE_CAPSULE | Freq: Two times a day (BID) | ORAL | Status: DC

## 2013-08-22 MED ORDER — MIDAZOLAM HCL 10 MG/2ML IJ SOLN
INTRAMUSCULAR | Status: AC
  Filled 2013-08-22: qty 2

## 2013-08-22 MED ORDER — MIDAZOLAM HCL 10 MG/2ML IJ SOLN
INTRAMUSCULAR | Status: DC | PRN
  Administered 2013-08-22 (×3): 2 mg via INTRAVENOUS

## 2013-08-22 MED ORDER — FENTANYL CITRATE 0.05 MG/ML IJ SOLN
INTRAMUSCULAR | Status: AC
  Filled 2013-08-22: qty 2

## 2013-08-22 NOTE — Op Note (Signed)
[place], [id number]   ENDOSCOPY PROCEDURE REPORT  PATIENT: Ronald [name], Ronald [name]  MR#: [id number] BIRTHDATE: [birth year] , 79  yrs. old GENDER: Male ENDOSCOPIST: Jerene Bears, MD PROCEDURE DATE:  08/22/2013 PROCEDURE:  EGD w/ biopsy for H.pylori ASA CLASS:     Class III INDICATIONS:  Acute Dysphagia.  History of GERD and esophageal ring last dilated 2009 by Dr. [name] MEDICATIONS: These medications were titrated to patient response per physician's verbal order, Fentanyl 75 mcg IV, and Versed 6 mg IV TOPICAL ANESTHETIC: Cetacaine Spray  DESCRIPTION OF PROCEDURE: After the risks benefits and alternatives of the procedure were thoroughly explained, informed consent was obtained.  The Pentax adult upper endoscope was introduced through the mouth and advanced to the second portion of the duodenum. Without limitations.  The instrument was slowly withdrawn as the mucosa was fully examined.   ESOPHAGUS: A single non-bleeding clean-based ulcer, ranging between 3-5 mm in size, was found at the gastroesophageal junction at the level of a Schatzki's ring. This is located 40 cm from the incisors.  This likely  is a result of pressure necrosis from food impaction which had spontaneously resolved at the time of endoscopy.  The ring was passable with the upper endoscope without resistance.  STOMACH: Moderate and congested acute gastritis (inflammation) was found in the gastric body and gastric antrum.  Multiple biopsies were performed using cold forceps.  DUODENUM: The duodenal mucosa showed no abnormalities in the bulb and second portion of the duodenum.  Retroflexed views revealed no abnormalities.     The scope was then withdrawn from the patient and the procedure completed.  COMPLICATIONS: There were no complications. ENDOSCOPIC IMPRESSION: 1.   Single non-bleeding ulcer, ranging between 3-5 mm in size, was found at the gastroesophageal  junction 2.   Schatzki ring was found 40 cm from the incisors 3.   Acute gastritis (inflammation) was found in the gastric body and gastric antrum; multiple biopsies 4.   The duodenal mucosa showed no abnormalities in the bulb and second portion of the duodenum  RECOMMENDATIONS: 1.  Increase PPI to twice daily 2.  Soft diet, avoiding breads and meats.  Chew food extremely well and take small bites 3.  Office follow-up with Dr.  [name] to discuss/schedule dilation  eSigned:  Jerene Bears, MD 08/22/2013 12:38 PM             CC:The Patient and [name], MD

## 2013-08-22 NOTE — H&P (Signed)
HPI: Mr. Wetmore is a 80 year old male with a past medical history of distal ileal carcinoid status post resection, Schatzki's ring status post dilation in 2009 with Dr. [name], prostate cancer, GERD, and kidney stones who called me today stating that he has been unable to swallow liquids or foods since yesterday afternoon. He reports he has a history of solid food dysphagia but never a history of food impaction. He ate a pork chop yesterday about 4 PM after which time he was unable to swallow liquids. He reports he tries to drink water but several minutes later it bubbles up in his mouth and he has to vomit. He denies dyspnea or chest pain. He does feel anxious since developing the food impaction but he was able to sleep last night.  No fevers or chills.  Past Medical History  Diagnosis Date  . Kidney stones   . History of alcohol abuse   . Malignant carcinoid tumor of ileum   . Prostate cancer   . Esophageal stricture   . GERD (gastroesophageal reflux disease)   . Crohn's     Past Surgical History  Procedure Laterality Date  . Appendectomy    . Tonsillectomy    . Prostatectomy    . Lithotripsy    . Small intestine surgery  05/19/02    Current Facility-Administered Medications  Medication Dose Route Frequency Provider Last Rate Last Dose  . 0.9 %  sodium chloride infusion   Intravenous Continuous Jerene Bears, MD       No current facility-administered medications on file prior to encounter.   Current Outpatient Prescriptions on File Prior to Encounter  Medication Sig Dispense Refill  . acetaminophen (TYLENOL ARTHRITIS PAIN) 650 MG CR tablet Take 650 mg by mouth daily as needed.        Marland Kitchen allopurinol (ZYLOPRIM) 300 MG tablet Take 150 mg by mouth daily.      Marland Kitchen aspirin 81 MG tablet Take 81 mg by mouth daily.      Marland Kitchen atorvastatin (LIPITOR) 80 MG tablet Take 80 mg by mouth at bedtime.      Marland Kitchen esomeprazole (NEXIUM) 40 MG capsule Take 1 capsule (40 mg total) by mouth daily before breakfast.   90 capsule  3  . labetalol (NORMODYNE) 100 MG tablet Take 100 mg by mouth 2 (two) times daily.        Marland Kitchen lisinopril (PRINIVIL,ZESTRIL) 20 MG tablet Take 20 mg by mouth 2 (two) times daily.       . mesalamine (APRISO) 0.375 G 24 hr capsule Take 4 tablets by mouth every morning.  360 capsule  3  . Multiple Vitamins-Minerals (CENTRUM SILVER ULTRA MENS PO) Take 1 tablet by mouth daily.      Marland Kitchen triamterene-hydrochlorothiazide (MAXZIDE-25) 37.5-25 MG per tablet Take 1 tablet by mouth daily.          No Known Allergies  Family History  Problem Relation Age of Onset  . Breast cancer Sister   . Colon cancer Father   . Heart disease Brother     x 2  . Heart disease Sister   . Cirrhosis Brother     alcohol releated  . Diabetes Sister     History  Substance Use Topics  . Smoking status: Former Smoker    Types: Cigarettes    Quit date: 07/16/1970  . Smokeless tobacco: Current User    Types: Chew  . Alcohol Use: Yes     Comment: occasional beer    ROS: As per  history of present illness, otherwise negative  BP 194/103  Pulse 71  Resp 14  SpO2 100% Gen: awake, alert, NAD HEENT: anicteric, op clear CV: RRR, no mrg Pulm: CTA b/l Abd: soft, NT/ND, +BS throughout Ext: no c/c/e Neuro: nonfocal   RELEVANT LABS AND IMAGING: CBC    Component Value Date/Time   WBC 6.7 07/13/2013 1251   RBC 3.50* 07/13/2013 1251   HGB 12.6* 07/13/2013 1251   HCT 36.1* 07/13/2013 1251   PLT 197 07/13/2013 1251   MCV 103.1* 07/13/2013 1251   MCH 36.0* 07/13/2013 1251   MCH 36.1* 04/27/2010 1053   MCHC 34.9 07/13/2013 1251   RDW 13.1 07/13/2013 1251   LYMPHSABS 1.9 07/13/2013 1251   MONOABS 0.3 07/13/2013 1251   EOSABS 0.1 07/13/2013 1251   BASOSABS 0.1 07/13/2013 1251    CMP     Component Value Date/Time   NA 139 07/13/2013 1251   NA 141 11/12/2011 1321   K 4.2 07/13/2013 1251   K 4.0 11/12/2011 1321   CL 104 05/13/2012 1040   CL 104 11/12/2011 1321   CO2 22 07/13/2013 1251   CO2 22  11/12/2011 1321   GLUCOSE 90 07/13/2013 1251   GLUCOSE 84 05/13/2012 1040   GLUCOSE 109* 11/12/2011 1321   BUN 15.7 07/13/2013 1251   BUN 17 11/12/2011 1321   CREATININE 1.4* 07/13/2013 1251   CREATININE 1.36* 11/12/2011 1321   CREATININE 2.1* 10/22/2005 1102   CALCIUM 9.3 07/13/2013 1251   CALCIUM 9.2 11/12/2011 1321   PROT 7.5 07/13/2013 1251   PROT 6.5 11/12/2011 1321   ALBUMIN 4.1 07/13/2013 1251   ALBUMIN 4.3 11/12/2011 1321   AST 33 07/13/2013 1251   AST 20 11/12/2011 1321   ALT 40 07/13/2013 1251   ALT 23 11/12/2011 1321   ALKPHOS 60 07/13/2013 1251   ALKPHOS 43 11/12/2011 1321   BILITOT 0.71 07/13/2013 1251   BILITOT 0.3 11/12/2011 1321    ASSESSMENT/PLAN:  80 year old male with a past medical history of distal ileal carcinoid status post resection, Schatzki's ring status post dilation in 2009 with Dr. [name], prostate cancer, GERD, and kidney stones who called me today stating that he has been unable to swallow liquids or foods since yesterday afternoon  1.  Acute food impaction -- history of GERD with Schatzki's ring. Symptoms consistent with food impaction. I recommended upper endoscopy for evaluation and treatment of food impaction.  The nature of the procedure, as well as the risks, benefits, and alternatives were carefully and thoroughly reviewed with the patient. Ample time for discussion and questions allowed. The patient understood, was satisfied, and agreed to proceed.   2.  HTN -- asymptomatic.  Not able to take BP meds due to impaction

## 2013-08-22 NOTE — Discharge Instructions (Signed)

## 2013-08-24 ENCOUNTER — Encounter (HOSPITAL_COMMUNITY): Payer: Self-pay | Admitting: Internal Medicine

## 2013-08-26 ENCOUNTER — Encounter: Payer: Self-pay | Admitting: Internal Medicine

## 2013-08-27 ENCOUNTER — Telehealth: Payer: Self-pay | Admitting: *Deleted

## 2013-08-27 NOTE — Telephone Encounter (Signed)
I have cancelled the 09/29/13 appointment with Dr [name]. Patient has been advised of this. I have also discussed endo with patient as well as his appointment for previsit. He verbalizes understanding of date, time and location of his appointment.

## 2013-08-27 NOTE — Telephone Encounter (Signed)
Thank you Ulice Dash, We will contact him. Tyliah Schlereth, please set up direct EGD/dil in [place] in nex 6 weeks ----- Message ----- From: Jerene Bears, MD Sent: 08/26/2013 4:42 PM To: Lafayette Dragon, MD Dora I did EGD on your patient over the weekend. Food impaction which had passed just before EGD. Ulcer at esophageal ring. I did not dilate due to the inflammation, but did double PPI. I expect he will need to be dilated and asked that he follow up with you. Sorry for the delay in forwarding you this note. Ulice Dash

## 2013-08-27 NOTE — Telephone Encounter (Signed)
Patient has been scheduled for EGD with Dil at [facility] 10/07/13. Dr [name], patient is currently scheduled to see you in the office on 09/29/13 as hospital follow up. Should patient still keep that appointment even though his follow up EGD is not until the 25th?

## 2013-08-27 NOTE — Telephone Encounter (Signed)
No I don't have to see him in the office. I talked to Dr Hilarie Fredrickson and he can be a direct EGD/dil in [place]. Please cancel his appointment.

## 2013-08-28 ENCOUNTER — Telehealth: Payer: Self-pay | Admitting: Internal Medicine

## 2013-08-28 NOTE — Telephone Encounter (Signed)
returned pt call and rs lab to another day per pt request

## 2013-09-28 ENCOUNTER — Ambulatory Visit (AMBULATORY_SURGERY_CENTER): Payer: Medicare Other | Admitting: *Deleted

## 2013-09-28 VITALS — Ht 71.5 in | Wt 192.8 lb

## 2013-09-28 DIAGNOSIS — R1310 Dysphagia, unspecified: Secondary | ICD-10-CM

## 2013-09-28 NOTE — Progress Notes (Signed)
Patient denies any allergies to eggs or soy. Patient denies any problems with anesthesia.

## 2013-09-29 ENCOUNTER — Ambulatory Visit: Payer: Medicare Other | Admitting: Internal Medicine

## 2013-10-07 ENCOUNTER — Ambulatory Visit (AMBULATORY_SURGERY_CENTER): Payer: Medicare Other | Admitting: Internal Medicine

## 2013-10-07 ENCOUNTER — Other Ambulatory Visit: Payer: Medicare Other

## 2013-10-07 ENCOUNTER — Telehealth: Payer: Self-pay | Admitting: *Deleted

## 2013-10-07 ENCOUNTER — Encounter: Payer: Self-pay | Admitting: Internal Medicine

## 2013-10-07 VITALS — BP 149/87 | HR 58 | Temp 97.3°F | Resp 16 | Ht 71.5 in | Wt 192.0 lb

## 2013-10-07 DIAGNOSIS — T18108A Unspecified foreign body in esophagus causing other injury, initial encounter: Secondary | ICD-10-CM

## 2013-10-07 DIAGNOSIS — K2940 Chronic atrophic gastritis without bleeding: Secondary | ICD-10-CM

## 2013-10-07 DIAGNOSIS — K222 Esophageal obstruction: Secondary | ICD-10-CM

## 2013-10-07 DIAGNOSIS — R1310 Dysphagia, unspecified: Secondary | ICD-10-CM

## 2013-10-07 DIAGNOSIS — T18128A Food in esophagus causing other injury, initial encounter: Secondary | ICD-10-CM

## 2013-10-07 DIAGNOSIS — K2960 Other gastritis without bleeding: Secondary | ICD-10-CM

## 2013-10-07 MED ORDER — ESOMEPRAZOLE MAGNESIUM 40 MG PO CPDR: 40.0000 mg | DELAYED_RELEASE_CAPSULE | Freq: Every day | ORAL | Status: DC

## 2013-10-07 MED ORDER — SODIUM CHLORIDE 0.9 % IV SOLN: 500.0000 mL | INTRAVENOUS | Status: DC

## 2013-10-07 NOTE — Progress Notes (Signed)
Patient given a total of 69m Labetalol for elevated BP, discussed with Dr. BOlevia Perches Blood pressure, it reduced to 140s 150s/ 80-90s. Within patients routine BPs..Marland Kitchen

## 2013-10-07 NOTE — Progress Notes (Signed)
Report to pacu rn, vss, bbs=clear 

## 2013-10-07 NOTE — Patient Instructions (Signed)
YOU HAD AN ENDOSCOPIC PROCEDURE TODAY AT [facility]: Refer to the procedure report that was given to you for any specific questions about what was found during the examination.  If the procedure report does not answer your questions, please call your gastroenterologist to clarify.  If you requested that your care partner not be given the details of your procedure findings, then the procedure report has been included in a sealed envelope for you to review at your convenience later.  YOU SHOULD EXPECT: Some feelings of bloating in the abdomen. Passage of more gas than usual.  Walking can help get rid of the air that was put into your GI tract during the procedure and reduce the bloating. If you had a lower endoscopy (such as a colonoscopy or flexible sigmoidoscopy) you may notice spotting of blood in your stool or on the toilet paper. If you underwent a bowel prep for your procedure, then you may not have a normal bowel movement for a few days.  DIET:nothing to eat or drink until 4:30. 4:30 until 5:30 only clear liquids. After 5:30 only soft foods until morning. Resume your regular diet in AM..  ACTIVITY: Your care partner should take you home directly after the procedure.  You should plan to take it easy, moving slowly for the rest of the day.  You can resume normal activity the day after the procedure however you should NOT DRIVE or use heavy machinery for 24 hours (because of the sedation medicines used during the test).    SYMPTOMS TO REPORT IMMEDIATELY: A gastroenterologist can be reached at any hour.  During normal business hours, 8:30 AM to 5:00 PM Monday through Friday, call [phone].  After hours and on weekends, please call the GI answering service at [phone] who will take a message and have the physician on call contact you.   Following upper endoscopy (EGD)  Vomiting of blood or coffee ground material  New chest pain or pain under the shoulder  blades  Painful or persistently difficult swallowing  New shortness of breath  Fever of 100F or higher  Black, tarry-looking stools  FOLLOW UP: If any biopsies were taken you will be contacted by phone or by letter within the next 1-3 weeks.  Call your gastroenterologist if you have not heard about the biopsies in 3 weeks.  Our staff will call the home number listed on your records the next business day following your procedure to check on you and address any questions or concerns that you may have at that time regarding the information given to you following your procedure. This is a courtesy call and so if there is no answer at the home number and we have not heard from you through the emergency physician on call, we will assume that you have returned to your regular daily activities without incident.  SIGNATURES/CONFIDENTIALITY: You and/or your care partner have signed paperwork which will be entered into your electronic medical record.  These signatures attest to the fact that that the information above on your After Visit Summary has been reviewed and is understood.  Full responsibility of the confidentiality of this discharge information lies with you and/or your care-partner.

## 2013-10-07 NOTE — Op Note (Signed)
[place]  [address] [place], [id number]   ENDOSCOPY PROCEDURE REPORT  PATIENT: Ronald, [name]  MR#: [id number] BIRTHDATE: 1933/09/07 , 79  yrs. old GENDER: Male ENDOSCOPIST: Lafayette Dragon, MD REFERRED BY:  Arturo Morton, M.D.  Dr D.Chism PROCEDURE DATE:  10/07/2013 PROCEDURE:  EGD w/ biopsy and Savary dilation of esophagus ASA CLASS:     Class II INDICATIONS:  reason for impaction on 08/22/2013.  History of esophageal stricture.Marland Kitchen MEDICATIONS: MAC sedation, administered by CRNA and propofol (Diprivan) 122m IV  , Labetolol 10 mg IV TOPICAL ANESTHETIC: Cetacaine Spray  DESCRIPTION OF PROCEDURE: After the risks benefits and alternatives of the procedure were thoroughly explained, informed consent was obtained.  The LB [id number] was introduced through the mouth and advanced to the second portion of the duodenum. Without limitations.  The instrument was slowly withdrawn as the mucosa was fully examined.      Esophagus column esophageal mucosa appeared normal to the upper mid and distal esophagus. There was a mild fibrotic stricture in distal esophagus which allowed the endoscope to traverse without difficulty. There was mild spasm associated for the stricture. The Z line was regular. There were no acute erosions. There was small reducible hiatal hernia Stomach: Gastric mucosa appeared normal throughout the body but there were multiple erosions intense erythema in the gastric antrum. Biopsies were obtained to rule out H. pylori Pyloric outlet was normal. There was no retained food. Retroflexion of the endoscope revealed normal fundus and cardia Duodenum: Duodenal bulb and descending was normal[ Savary dilator 15,16 and 17 mm passed over a guidewire which was placed endoscopically in the gastric antrum there was no blood on the dilator The scope was then withdrawn from the patient and the procedure completed.  COMPLICATIONS: There  were no complications. ENDOSCOPIC IMPRESSION:  mild distal esophageal stricture. Status post dilatation to 17 mm Antral gastritis. Status post biopsies to rule out H. pylori  RECOMMENDATIONS: 1.  Await pathology results 2.  Anti-reflux regimen to be follow 3.  Continue PPI  REPEAT EXAM: for EGD pending biopsy results.  eSigned:  DLafayette Dragon MD 10/07/2013 3:43 PM   CC:

## 2013-10-07 NOTE — Telephone Encounter (Signed)
Per Dr [name], patient to have Nexium 40 mg once daily #90 with 3 refills sent to [facility]. Rx has been sent.

## 2013-10-07 NOTE — Progress Notes (Signed)
Called to room to assist during endoscopic procedure.  Patient ID and intended procedure confirmed with present staff. Received instructions for my participation in the procedure from the performing physician.  

## 2013-10-08 ENCOUNTER — Telehealth: Payer: Self-pay

## 2013-10-08 NOTE — Telephone Encounter (Signed)
  Follow up Call-  Call back number 10/07/2013  Post procedure Call Back phone  # [phone]  Permission to leave phone message Yes     Patient questions:  Do you have a fever, pain , or abdominal swelling? no Pain Score  0 *  Have you tolerated food without any problems? yes  Have you been able to return to your normal activities? yes  Do you have any questions about your discharge instructions: Diet   no Medications  no Follow up visit  no  Do you have questions or concerns about your Care? no  Actions: * If pain score is 4 or above: No action needed, pain <4.

## 2013-10-12 ENCOUNTER — Encounter: Payer: Self-pay | Admitting: Internal Medicine

## 2013-10-13 ENCOUNTER — Encounter: Payer: Self-pay | Admitting: *Deleted

## 2013-10-13 ENCOUNTER — Telehealth: Payer: Self-pay | Admitting: Internal Medicine

## 2013-10-13 NOTE — Telephone Encounter (Signed)
pt called and stated that he was not going to come to the lab on 4.1.15.  cx per pt request.

## 2013-10-14 ENCOUNTER — Other Ambulatory Visit: Payer: Medicare Other

## 2014-01-11 ENCOUNTER — Other Ambulatory Visit (HOSPITAL_BASED_OUTPATIENT_CLINIC_OR_DEPARTMENT_OTHER): Payer: Medicare Other

## 2014-01-11 ENCOUNTER — Ambulatory Visit (HOSPITAL_BASED_OUTPATIENT_CLINIC_OR_DEPARTMENT_OTHER): Payer: Medicare Other | Admitting: Internal Medicine

## 2014-01-11 ENCOUNTER — Other Ambulatory Visit: Payer: Self-pay | Admitting: Internal Medicine

## 2014-01-11 VITALS — BP 159/74 | HR 67 | Temp 98.1°F | Resp 18 | Ht 71.5 in | Wt 190.7 lb

## 2014-01-11 DIAGNOSIS — Z8546 Personal history of malignant neoplasm of prostate: Secondary | ICD-10-CM

## 2014-01-11 DIAGNOSIS — R7989 Other specified abnormal findings of blood chemistry: Secondary | ICD-10-CM

## 2014-01-11 DIAGNOSIS — Z859 Personal history of malignant neoplasm, unspecified: Secondary | ICD-10-CM

## 2014-01-11 DIAGNOSIS — C7A012 Malignant carcinoid tumor of the ileum: Secondary | ICD-10-CM

## 2014-01-11 DIAGNOSIS — D649 Anemia, unspecified: Secondary | ICD-10-CM

## 2014-01-11 LAB — CBC WITH DIFFERENTIAL/PLATELET
BASO%: 0.9 % (ref 0.0–2.0)
Basophils Absolute: 0.1 10*3/uL (ref 0.0–0.1)
EOS%: 1.1 % (ref 0.0–7.0)
Eosinophils Absolute: 0.1 10*3/uL (ref 0.0–0.5)
HCT: 36.9 % — ABNORMAL LOW (ref 38.4–49.9)
HGB: 12.6 g/dL — ABNORMAL LOW (ref 13.0–17.1)
LYMPH%: 22.6 % (ref 14.0–49.0)
MCH: 35.1 pg — ABNORMAL HIGH (ref 27.2–33.4)
MCHC: 34 g/dL (ref 32.0–36.0)
MCV: 103.1 fL — ABNORMAL HIGH (ref 79.3–98.0)
MONO#: 0.3 10*3/uL (ref 0.1–0.9)
MONO%: 4.7 % (ref 0.0–14.0)
NEUT#: 4.3 10*3/uL (ref 1.5–6.5)
NEUT%: 70.7 % (ref 39.0–75.0)
PLATELETS: 188 10*3/uL (ref 140–400)
RBC: 3.58 10*6/uL — AB (ref 4.20–5.82)
RDW: 12.9 % (ref 11.0–14.6)
WBC: 6.1 10*3/uL (ref 4.0–10.3)
lymph#: 1.4 10*3/uL (ref 0.9–3.3)

## 2014-01-11 LAB — COMPREHENSIVE METABOLIC PANEL (CC13)
ALK PHOS: 54 U/L (ref 40–150)
ALT: 28 U/L (ref 0–55)
ANION GAP: 11 meq/L (ref 3–11)
AST: 31 U/L (ref 5–34)
Albumin: 4.1 g/dL (ref 3.5–5.0)
BILIRUBIN TOTAL: 0.52 mg/dL (ref 0.20–1.20)
BUN: 14.7 mg/dL (ref 7.0–26.0)
CO2: 20 meq/L — AB (ref 22–29)
Calcium: 9.4 mg/dL (ref 8.4–10.4)
Chloride: 108 mEq/L (ref 98–109)
Creatinine: 1.5 mg/dL — ABNORMAL HIGH (ref 0.7–1.3)
Glucose: 83 mg/dl (ref 70–140)
Potassium: 4.2 mEq/L (ref 3.5–5.1)
Sodium: 139 mEq/L (ref 136–145)
Total Protein: 7.1 g/dL (ref 6.4–8.3)

## 2014-01-11 LAB — IRON AND TIBC CHCC
%SAT: 44 % (ref 20–55)
IRON: 95 ug/dL (ref 42–163)
TIBC: 218 ug/dL (ref 202–409)
UIBC: 123 ug/dL (ref 117–376)

## 2014-01-11 LAB — FERRITIN CHCC: Ferritin: 1970 ng/ml — ABNORMAL HIGH (ref 22–316)

## 2014-01-12 ENCOUNTER — Telehealth: Payer: Self-pay | Admitting: Internal Medicine

## 2014-01-12 ENCOUNTER — Encounter: Payer: Self-pay | Admitting: Internal Medicine

## 2014-01-12 ENCOUNTER — Telehealth: Payer: Self-pay | Admitting: Medical Oncology

## 2014-01-12 LAB — PSA: PSA: 0.01 ng/mL (ref <=4.00)

## 2014-01-12 NOTE — Progress Notes (Signed)
[facility] OFFICE PROGRESS NOTE  No PCP Per Patient No address on file  DIAGNOSIS: Elevated ferritin - Plan: Ferritin, Hemochromatosis DNA, PCR, CBC with Differential, Comprehensive metabolic panel (Cmet) - [facility], Lactate dehydrogenase (LDH) - [facility]  PROSTATE CANCER, HX OF - Plan: CBC with Differential, Comprehensive metabolic panel (Cmet) - [facility], Lactate dehydrogenase (LDH) - [facility], PSA  Malignant carcinoid tumor of the ileum  Chief Complaint  Patient presents with  . PROSTATE CANCER, HX OF    CURRENT THERAPY: Observation.   INTERVAL HISTORY: Ronald [name] 80 y.o. 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  He was last seen by Dr. Santiago Bur on 07/13/2013.    He is followed by Dr. [name] of GI for his crohn's disease of the terminal ileum in remission. He had an Endoscopy by Dr. [name] on 10/07/2013 due to food impaction. Surgical pathology revealed  Benign gastric mucosa without evidence of malignancy.  He is also followed by Dr. Dimas Aguas, nephrologist.  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 He denies diarrhea, abdominal pain or weight changes. He is without symptoms relating to either of these cancers. He denies any symptoms to suggest carcinoid syndrome. No diarrhea or flushing. He denies any difficulty with urination, other than nocturia.   MEDICAL HISTORY: Past Medical History  Diagnosis Date  . Kidney stones   . History of alcohol abuse   . Malignant carcinoid tumor of ileum   . Prostate cancer   . Esophageal stricture   . GERD (gastroesophageal reflux disease)   . Crohn's     INTERIM HISTORY: has MALIGNANT CARCINOID TUMOR OF THE ILEUM; ESOPHAGEAL STRICTURE; GERD; CROHN'S DISEASE; PROSTATE CANCER, HX OF; ALCOHOL ABUSE, HX OF; SMALL BOWEL OBSTRUCTION, HX OF; Abnormal liver function tests; Unspecified deficiency anemia; Elevated ferritin; and Food  impaction of esophagus on his problem list.    ALLERGIES:  has No Known Allergies.  MEDICATIONS: has a current medication list which includes the following prescription(s): acetaminophen, allopurinol, aspirin, atorvastatin, esomeprazole, labetalol, lisinopril, mesalamine, multiple vitamins-minerals, and triamterene-hydrochlorothiazide.  SURGICAL HISTORY:  Past Surgical History  Procedure Laterality Date  . Appendectomy    . Tonsillectomy    . Prostatectomy    . Lithotripsy    . Small intestine surgery  05/19/02  . Esophagogastroduodenoscopy N/A 08/22/2013    Procedure: ESOPHAGOGASTRODUODENOSCOPY (EGD);  Surgeon: Jerene Bears, MD;  Location: [facility];  Service: Endoscopy;  Laterality: N/A;   PROBLEM LIST:  1. 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 The patient underwent small bowel resection on 05/19/2002.  2. Adenocarcinoma of the prostate, status post surgery on 06/30/2003.  3. Renal insufficiency.  4. Anemia with elevated red cell indices.  5. History of Crohn's disease dating back to 10/2001.  6. COPD.  7. History of kidney stones.  8. Hypertension.  9. Dyslipidemia.  10. Gout diagnosed 10/2011. Symptoms affect the left foot, being treated with Medrol.  11. History of alcohol use.  12. Elevated ferritin 1960 on 11/12/2011. Iron saturation was 41%.  REVIEW OF SYSTEMS:   Constitutional: Denies fevers, chills or abnormal weight loss Eyes: Denies blurriness of vision Ears, nose, mouth, throat, and face: Denies mucositis or sore throat Respiratory: Denies cough, dyspnea or wheezes Cardiovascular: Denies palpitation, chest discomfort or lower extremity swelling Gastrointestinal:  Denies nausea, heartburn or change in bowel habits Skin: Denies abnormal skin rashes Lymphatics:  Denies new lymphadenopathy or easy bruising Neurological:Denies numbness, tingling or new weaknesses Behavioral/Psych: Mood is stable, no new  changes  All other systems were reviewed with the patient and are negative.  PHYSICAL EXAMINATION: ECOG PERFORMANCE STATUS: 1 - Symptomatic but completely ambulatory  Blood pressure 159/74, pulse 67, temperature 98.1 F (36.7 C), temperature source Oral, resp. rate 18, height 5' 11.5" (1.816 m), weight 190 lb 11.2 oz (86.501 kg).  GENERAL:alert, no distress and comfortable SKIN: skin color, texture, turgor are normal, no rashes or significant lesions EYES: normal, Conjunctiva are pink and non-injected, sclera clear OROPHARYNX:no exudate, no erythema and lips, buccal mucosa, and tongue normal  NECK: supple, thyroid normal size, non-tender, without nodularity LYMPH:  no palpable lymphadenopathy in the cervical, axillary or supraclavicular LUNGS: clear to auscultation and percussion with normal breathing effort HEART: regular rate & rhythm and no murmurs and no lower extremity edema ABDOMEN:abdomen soft, non-tender and normal bowel sounds Musculoskeletal:no cyanosis of digits and no clubbing  NEURO: alert & oriented x 3 with fluent speech, no focal motor/sensory deficits   LABORATORY DATA: Results for orders placed in visit on 01/11/14 (from the past 48 hour(s))  CBC WITH DIFFERENTIAL     Status: Abnormal   Collection Time    01/11/14 12:58 PM      Result Value Ref Range   WBC 6.1  4.0 - 10.3 10e3/uL   NEUT# 4.3  1.5 - 6.5 10e3/uL   HGB 12.6 (*) 13.0 - 17.1 g/dL   HCT 36.9 (*) 38.4 - 49.9 %   Platelets 188  140 - 400 10e3/uL   MCV 103.1 (*) 79.3 - 98.0 fL   MCH 35.1 (*) 27.2 - 33.4 pg   MCHC 34.0  32.0 - 36.0 g/dL   RBC 3.58 (*) 4.20 - 5.82 10e6/uL   RDW 12.9  11.0 - 14.6 %   lymph# 1.4  0.9 - 3.3 10e3/uL   MONO# 0.3  0.1 - 0.9 10e3/uL   Eosinophils Absolute 0.1  0.0 - 0.5 10e3/uL   Basophils Absolute 0.1  0.0 - 0.1 10e3/uL   NEUT% 70.7  39.0 - 75.0 %   LYMPH% 22.6  14.0 - 49.0 %   MONO% 4.7  0.0 - 14.0 %   EOS% 1.1  0.0 - 7.0 %   BASO% 0.9  0.0 - 2.0 %  FERRITIN [facility]      Status: Abnormal   Collection Time    01/11/14 12:58 PM      Result Value Ref Range   Ferritin 1,970 (*) 22 - 316 ng/ml  IRON AND TIBC [facility]     Status: None   Collection Time    01/11/14 12:58 PM      Result Value Ref Range   Iron 95  42 - 163 ug/dL   TIBC 218  202 - 409 ug/dL   UIBC 123  117 - 376 ug/dL   %SAT 44  20 - 55 %  COMPREHENSIVE METABOLIC PANEL (QI69)     Status: Abnormal   Collection Time    01/11/14 12:58 PM      Result Value Ref Range   Sodium 139  136 - 145 mEq/L   Potassium 4.2  3.5 - 5.1 mEq/L   Chloride 108  98 - 109 mEq/L   CO2 20 (*) 22 - 29 mEq/L   Glucose 83  70 - 140 mg/dl   BUN 14.7  7.0 - 26.0 mg/dL   Creatinine 1.5 (*) 0.7 - 1.3 mg/dL  Total Bilirubin 0.52  0.20 - 1.20 mg/dL   Alkaline Phosphatase 54  40 - 150 U/L   AST 31  5 - 34 U/L   ALT 28  0 - 55 U/L   Total Protein 7.1  6.4 - 8.3 g/dL   Albumin 4.1  3.5 - 5.0 g/dL   Calcium 9.4  8.4 - 10.4 mg/dL   Anion Gap 11  3 - 11 mEq/L  PSA     Status: None   Collection Time    01/11/14 12:58 PM      Result Value Ref Range   PSA 0.01  <=4.00 ng/mL   Comment: Test Methodology: ECLIA PSA (Electrochemiluminescence Immunoassay) For PSA values from 2.5-4.0, particularly in younger men <26 yearsold, the AUA and NCCN suggest testing for % Free PSA (3515) andevaluation of the rate of increase in PSA (PSA velocity).    Labs:  Lab Results  Component Value Date   WBC 6.1 01/11/2014   HGB 12.6* 01/11/2014   HCT 36.9* 01/11/2014   MCV 103.1* 01/11/2014   PLT 188 01/11/2014   NEUTROABS 4.3 01/11/2014      Chemistry      Component Value Date/Time   NA 139 01/11/2014 1258   NA 141 11/12/2011 1321   K 4.2 01/11/2014 1258   K 4.0 11/12/2011 1321   CL 104 05/13/2012 1040   CL 104 11/12/2011 1321   CO2 20* 01/11/2014 1258   CO2 22 11/12/2011 1321   BUN 14.7 01/11/2014 1258   BUN 17 11/12/2011 1321   CREATININE 1.5* 01/11/2014 1258   CREATININE 1.36* 11/12/2011 1321   CREATININE 2.1* 10/22/2005 1102      Component  Value Date/Time   CALCIUM 9.4 01/11/2014 1258   CALCIUM 9.2 11/12/2011 1321   ALKPHOS 54 01/11/2014 1258   ALKPHOS 43 11/12/2011 1321   AST 31 01/11/2014 1258   AST 20 11/12/2011 1321   ALT 28 01/11/2014 1258   ALT 23 11/12/2011 1321   BILITOT 0.52 01/11/2014 1258   BILITOT 0.3 11/12/2011 1321      Basic Metabolic Panel:  Recent Labs Lab 01/11/14 1258  NA 139  K 4.2  CO2 20*  GLUCOSE 83  BUN 14.7  CREATININE 1.5*  CALCIUM 9.4   GFR Estimated Creatinine Clearance: 43.9 ml/min (by C-G formula based on Cr of 1.5). Liver Function Tests:  Recent Labs Lab 01/11/14 1258  AST 31  ALT 28  ALKPHOS 54  BILITOT 0.52  PROT 7.1  ALBUMIN 4.1   CBC:  Recent Labs Lab 01/11/14 1258  WBC 6.1  NEUTROABS 4.3  HGB 12.6*  HCT 36.9*  MCV 103.1*  PLT 188   Iron/TIBC/Ferritin    Component Value Date/Time   IRON 95 01/11/2014 1258   IRON 112 01/08/2013 1136   TIBC 218 01/11/2014 1258   TIBC 231 01/08/2013 1136   FERRITIN 1,970* 01/11/2014 1258   FERRITIN 1881* 01/08/2013 1136   Studies:  No results found.   RADIOGRAPHIC STUDIES: No results found.  ASSESSMENT: Rochele Pages Santiago 80 y.o. male with a history of Elevated ferritin - Plan: Ferritin, Hemochromatosis DNA, PCR, CBC with Differential, Comprehensive metabolic panel (Cmet) - [facility], Lactate dehydrogenase (LDH) - [facility]  PROSTATE CANCER, HX OF - Plan: CBC with Differential, Comprehensive metabolic panel (Cmet) - [facility], Lactate dehydrogenase (LDH) - [facility], PSA  Malignant carcinoid tumor of the ileum   PLAN:   1. Prostate Cancer. --Mr. Sayegh continues to do well. At this point, there does not appear to  be any evidence for recurrence of his adenocarcinoma of the prostate approaching 10 years from diagnosis.  Status post surgery on 06/30/2003 He denies any gu symptoms presently.  His PSA is 0.01 today. We will repeat this lab in 3 months and persistent increase in doubling time, he will be referred to urology or radiation oncology.    2. Carcinoid tumor.  --At this point, there does not appear to be any evidence for recurrence of his carcinoid tumor now more than 10 years for the carcinoid tumor. He denies any symptoms of diarrhea or flushing.   3. Questionable secondary hemochromatosis.  -- We will check iron studies and ferritin.  Ferritin remains elevated. He has normal liver function tests. He was advised to stop taking supplemental iron back when his ferritin level was elevated to 1960 back over a year ago.  If the iron studies continue to be elevated, then the next step would be for the patient to undergo genetic testing for hemochromatosis.  We will order genetic testing with next lab visit.   4. Crohn's disease --Management per GI (Dr. [name])  5. Follow-up. -- We will plan to have Mr. Moss return in 3 months at which  time we will check CBC, chemistries, iron studies, and a PSA and hemochromatosis PCR   All questions were answered. The patient knows to call the clinic with any problems, questions or concerns. We can certainly see the patient much sooner if necessary.  I spent 15 minutes counseling the patient face to face. The total time spent in the appointment was 25 minutes.    CHISM, DAVID, MD 01/12/2014 9:23 AM

## 2014-01-12 NOTE — Telephone Encounter (Signed)
Pt called asking about his lab results. He requested a copy be mailed to his home. He also asked why Dr. Juliann Mule has him scheduled for labs in Sept. Per his note he wanted to repeat PSA and refer him to Urologist if it continued to rise. He was adamant about cancelling this appointment. He states he see his kidney MD and he will see him this fall. I cancelled appointment per patient's request. Results mailed to pt.

## 2014-01-12 NOTE — Telephone Encounter (Signed)
Called pt lft msg of apt of Sept/Dec.

## 2014-03-25 ENCOUNTER — Other Ambulatory Visit: Payer: Self-pay

## 2014-03-25 MED ORDER — MESALAMINE ER 0.375 G PO CP24: ORAL_CAPSULE | ORAL | Status: DC

## 2014-04-14 ENCOUNTER — Other Ambulatory Visit: Payer: Self-pay

## 2014-04-15 ENCOUNTER — Telehealth: Payer: Self-pay | Admitting: Hematology

## 2014-04-15 NOTE — Telephone Encounter (Signed)
received forwarded message from desk nurse from pt inquiring about appts. s/w pt re appt for 12/30 and mailed schedule.

## 2014-06-28 ENCOUNTER — Telehealth: Payer: Self-pay | Admitting: Hematology and Oncology

## 2014-06-28 NOTE — Telephone Encounter (Signed)
moved from CP1 to [place]. s/w pt re new appt for lb/YF 12/30 @ 12:30pm.

## 2014-07-12 ENCOUNTER — Telehealth: Payer: Self-pay | Admitting: Hematology

## 2014-07-12 NOTE — Telephone Encounter (Signed)
Pt called and r/s appt to 08/10/14, confirm. Mailed cal.

## 2014-07-14 ENCOUNTER — Other Ambulatory Visit: Payer: Self-pay

## 2014-07-14 ENCOUNTER — Ambulatory Visit: Payer: Self-pay | Admitting: Hematology

## 2014-08-04 ENCOUNTER — Other Ambulatory Visit: Payer: Self-pay

## 2014-08-04 ENCOUNTER — Ambulatory Visit: Payer: Self-pay | Admitting: Hematology

## 2014-08-09 ENCOUNTER — Other Ambulatory Visit: Payer: Self-pay | Admitting: *Deleted

## 2014-08-09 DIAGNOSIS — D539 Nutritional anemia, unspecified: Secondary | ICD-10-CM

## 2014-08-09 DIAGNOSIS — Z8546 Personal history of malignant neoplasm of prostate: Secondary | ICD-10-CM

## 2014-08-09 DIAGNOSIS — C7A012 Malignant carcinoid tumor of the ileum: Secondary | ICD-10-CM

## 2014-08-10 ENCOUNTER — Ambulatory Visit (HOSPITAL_BASED_OUTPATIENT_CLINIC_OR_DEPARTMENT_OTHER): Payer: Medicare Other | Admitting: Hematology

## 2014-08-10 ENCOUNTER — Other Ambulatory Visit (HOSPITAL_BASED_OUTPATIENT_CLINIC_OR_DEPARTMENT_OTHER): Payer: Medicare Other

## 2014-08-10 ENCOUNTER — Encounter: Payer: Self-pay | Admitting: Hematology

## 2014-08-10 ENCOUNTER — Telehealth: Payer: Self-pay | Admitting: Hematology

## 2014-08-10 VITALS — BP 166/86 | HR 73 | Temp 98.9°F | Resp 18 | Ht 71.5 in | Wt 191.0 lb

## 2014-08-10 DIAGNOSIS — N189 Chronic kidney disease, unspecified: Secondary | ICD-10-CM

## 2014-08-10 DIAGNOSIS — K5090 Crohn's disease, unspecified, without complications: Secondary | ICD-10-CM

## 2014-08-10 DIAGNOSIS — Z8546 Personal history of malignant neoplasm of prostate: Secondary | ICD-10-CM

## 2014-08-10 DIAGNOSIS — C7A012 Malignant carcinoid tumor of the ileum: Secondary | ICD-10-CM

## 2014-08-10 DIAGNOSIS — D631 Anemia in chronic kidney disease: Secondary | ICD-10-CM

## 2014-08-10 DIAGNOSIS — D539 Nutritional anemia, unspecified: Secondary | ICD-10-CM

## 2014-08-10 DIAGNOSIS — D638 Anemia in other chronic diseases classified elsewhere: Secondary | ICD-10-CM

## 2014-08-10 LAB — COMPREHENSIVE METABOLIC PANEL (CC13)
ALK PHOS: 50 U/L (ref 40–150)
ALT: 18 U/L (ref 0–55)
ANION GAP: 11 meq/L (ref 3–11)
AST: 20 U/L (ref 5–34)
Albumin: 3.9 g/dL (ref 3.5–5.0)
BILIRUBIN TOTAL: 0.66 mg/dL (ref 0.20–1.20)
BUN: 13.5 mg/dL (ref 7.0–26.0)
CO2: 22 mEq/L (ref 22–29)
CREATININE: 1.3 mg/dL (ref 0.7–1.3)
Calcium: 9.4 mg/dL (ref 8.4–10.4)
Chloride: 107 mEq/L (ref 98–109)
EGFR: 53 mL/min/{1.73_m2} — AB (ref >90)
GLUCOSE: 76 mg/dL (ref 70–140)
Potassium: 4.3 mEq/L (ref 3.5–5.1)
SODIUM: 139 meq/L (ref 136–145)
Total Protein: 6.9 g/dL (ref 6.4–8.3)

## 2014-08-10 LAB — CBC WITH DIFFERENTIAL/PLATELET
BASO%: 0.9 % (ref 0.0–2.0)
BASOS ABS: 0.1 10*3/uL (ref 0.0–0.1)
EOS%: 3.4 % (ref 0.0–7.0)
Eosinophils Absolute: 0.2 10*3/uL (ref 0.0–0.5)
HEMATOCRIT: 37.7 % — AB (ref 38.4–49.9)
HGB: 12.4 g/dL — ABNORMAL LOW (ref 13.0–17.1)
LYMPH#: 1.3 10*3/uL (ref 0.9–3.3)
LYMPH%: 23.7 % (ref 14.0–49.0)
MCH: 33.5 pg — ABNORMAL HIGH (ref 27.2–33.4)
MCHC: 33 g/dL (ref 32.0–36.0)
MCV: 101.5 fL — ABNORMAL HIGH (ref 79.3–98.0)
MONO#: 0.3 10*3/uL (ref 0.1–0.9)
MONO%: 4.5 % (ref 0.0–14.0)
NEUT#: 3.7 10*3/uL (ref 1.5–6.5)
NEUT%: 67.5 % (ref 39.0–75.0)
Platelets: 211 10*3/uL (ref 140–400)
RBC: 3.71 10*6/uL — AB (ref 4.20–5.82)
RDW: 12.6 % (ref 11.0–14.6)
WBC: 5.5 10*3/uL (ref 4.0–10.3)

## 2014-08-10 LAB — TECHNOLOGIST REVIEW

## 2014-08-10 LAB — FERRITIN CHCC: Ferritin: 1803 ng/ml — ABNORMAL HIGH (ref 22–316)

## 2014-08-10 LAB — LACTATE DEHYDROGENASE (CC13): LDH: 152 U/L (ref 125–245)

## 2014-08-10 NOTE — Progress Notes (Signed)
[place] OFFICE PROGRESS NOTE  No PCP Per Patient No address on file  DIAGNOSIS: No diagnosis found.  No chief complaint on file.   CURRENT THERAPY: Observation.   INTERVAL HISTORY: BROUGHTON EPPINGER 81 y.o. 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  He was last seen by Dr. Juliann Mule 6 months ago.   He feels well overall, no complains. He lives by himself and still works. No ED visit or hospitalization in the past 6 month. He has good energy and appetite, no weight loss.       MEDICAL HISTORY: Past Medical History  Diagnosis Date  . Kidney stones   . History of alcohol abuse   . Malignant carcinoid tumor of ileum   . Prostate cancer   . Esophageal stricture   . GERD (gastroesophageal reflux disease)   . Crohn's     INTERIM HISTORY: has Malignant carcinoid tumor of ileum; ESOPHAGEAL STRICTURE; GERD; CROHN'S DISEASE; PROSTATE CANCER, HX OF; ALCOHOL ABUSE, HX OF; SMALL BOWEL OBSTRUCTION, HX OF; Abnormal liver function tests; Deficiency anemia; Elevated ferritin; and Food impaction of esophagus on his problem list.    ALLERGIES:  has No Known Allergies.  MEDICATIONS: has a current medication list which includes the following prescription(s): acetaminophen, allopurinol, aspirin, atorvastatin, esomeprazole, labetalol, lisinopril, mesalamine, multiple vitamins-minerals, and triamterene-hydrochlorothiazide.  SURGICAL HISTORY:  Past Surgical History  Procedure Laterality Date  . Appendectomy    . Tonsillectomy    . Prostatectomy    . Lithotripsy    . Small intestine surgery  05/19/02  . Esophagogastroduodenoscopy N/A 08/22/2013    Procedure: ESOPHAGOGASTRODUODENOSCOPY (EGD);  Surgeon: Jerene Bears, MD;  Location: [facility];  Service: Endoscopy;  Laterality: N/A;   PROBLEM LIST:  1. Carcinoid tumor of the mid ileum 1.5 cm with 1/6 positive  mesenteric lymph nodes, stage III (T3  N1). The patient underwent small bowel resection on 05/19/2002.  2. Adenocarcinoma of the prostate, status post surgery on 06/30/2003.  3. Renal insufficiency.  4. Anemia with elevated red cell indices.  5. History of Crohn's disease dating back to 10/2001.  6. COPD.  7. History of kidney stones.  8. Hypertension.  9. Dyslipidemia.  10. Gout diagnosed 10/2011. Symptoms affect the left foot, being treated with Medrol.  11. History of alcohol use.  12. Elevated ferritin 1960 on 11/12/2011. Iron saturation was 41%.  REVIEW OF SYSTEMS:   Constitutional: Denies fevers, chills or abnormal weight loss Eyes: Denies blurriness of vision Ears, nose, mouth, throat, and face: Denies mucositis or sore throat Respiratory: Denies cough, dyspnea or wheezes Cardiovascular: Denies palpitation, chest discomfort or lower extremity swelling Gastrointestinal:  Denies nausea, heartburn or change in bowel habits Skin: Denies abnormal skin rashes Lymphatics: Denies new lymphadenopathy or easy bruising Neurological:Denies numbness, tingling or new weaknesses Behavioral/Psych: Mood is stable, no new changes  All other systems were reviewed with the patient and are negative.  PHYSICAL EXAMINATION: ECOG PERFORMANCE STATUS: 1 - Symptomatic but completely ambulatory  Blood pressure 166/86, pulse 73, temperature 98.9 F (37.2 C), temperature source Oral, resp. rate 18, height 5' 11.5" (1.816 m), weight 191 lb (86.637 kg).  GENERAL:alert, no distress and comfortable SKIN: skin color, texture, turgor are normal, no rashes or significant lesions EYES: normal, Conjunctiva are pink and non-injected, sclera clear OROPHARYNX:no exudate, no erythema and lips, buccal mucosa, and tongue normal  NECK: supple, thyroid normal size, non-tender, without nodularity LYMPH:  no palpable lymphadenopathy  in the cervical, axillary or supraclavicular LUNGS: clear to auscultation and percussion with normal breathing effort HEART:  regular rate & rhythm and no murmurs and no lower extremity edema ABDOMEN:abdomen soft, non-tender and normal bowel sounds Musculoskeletal:no cyanosis of digits and no clubbing  NEURO: alert & oriented x 3 with fluent speech, no focal motor/sensory deficits   LABORATORY DATA: CBC Latest Ref Rng 08/10/2014 01/11/2014 07/13/2013  WBC 4.0 - 10.3 10e3/uL 5.5 6.1 6.7  Hemoglobin 13.0 - 17.1 g/dL 12.4(L) 12.6(L) 12.6(L)  Hematocrit 38.4 - 49.9 % 37.7(L) 36.9(L) 36.1(L)  Platelets 140 - 400 10e3/uL 211 188 197    CMP Latest Ref Rng 01/11/2014 07/13/2013 01/08/2013  Glucose 70 - 140 mg/dl 83 90 85  BUN 7.0 - 26.0 mg/dL 14.7 15.7 12.2  Creatinine 0.7 - 1.3 mg/dL 1.5(H) 1.4(H) 1.4(H)  Sodium 136 - 145 mEq/L 139 139 138  Potassium 3.5 - 5.1 mEq/L 4.2 4.2 4.2  Chloride 98 - 107 mEq/L - - -  CO2 22 - 29 mEq/L 20(L) 22 21(L)  Calcium 8.4 - 10.4 mg/dL 9.4 9.3 9.3  Total Protein 6.4 - 8.3 g/dL 7.1 7.5 6.9  Total Bilirubin 0.20 - 1.20 mg/dL 0.52 0.71 0.71  Alkaline Phos 40 - 150 U/L 54 60 57  AST 5 - 34 U/L 31 33 23  ALT 0 - 55 U/L 28 40 19      RADIOGRAPHIC STUDIES: No results found.  ASSESSMENT: Ronald [name] 81 y.o. male with a history of No diagnosis found.   PLAN:   1. Prostate Cancer. --He is 11 years out of his surgery. Clinically doing very well. PSA was normal 6 months ago, today's results are still pending. There is no evidence of recurrence at this point. -Continue monitoring.  2. Carcinoid tumor.  --At this point, there does not appear to be any evidence for recurrence of his carcinoid tumor now more than 10 years for the carcinoid tumor. He denies any symptoms of diarrhea or flushing.   3. Anemia of chronic disease (CKD and Crohn's disease) -He has a mild anemia, normal iron and TIBC in the past, elevated ferritin. This is likely anemia of chronic disease secondary to see daily and Crohn's disease. -His hemoglobin is 12.4 today. Continue monitoring.  4. Questionable  secondary hemochromatosis.  -- He has elevated ferritin level, likely secondary to his Crohn's disease. We'll check hemochromatosis gene mutation to ruled out hemochromatosis. -We'll continue monitoring.  4. Crohn's disease --Management per GI (Dr. [name])  5. Follow-up. -- We will plan to have Mr. Ramnath return in 6 months with repeated CBC, chemistries, iron studies, and a PSA and hemochromatosis PCR   All questions were answered. The patient knows to call the clinic with any problems, questions or concerns. We can certainly see the patient much sooner if necessary.  I spent 15 minutes counseling the patient face to face. The total time spent in the appointment was 25 minutes.    Truitt Merle, MD @T (<PARAMETER> error)@ 12:55 PM

## 2014-08-10 NOTE — Telephone Encounter (Signed)
gv and printed appt sched and avs fo rpt for July

## 2014-08-11 LAB — PSA: PSA: <0.01 ng/mL (ref <=4.00)

## 2014-08-19 ENCOUNTER — Encounter: Payer: Self-pay | Admitting: *Deleted

## 2014-08-19 NOTE — Progress Notes (Signed)
Received voice message from patient requesting that the most recent labs be mailed to his home address. Labs printed and address verified. Placed in outgoing mail on 08/19/14.

## 2014-08-20 ENCOUNTER — Telehealth: Payer: Self-pay

## 2014-08-20 NOTE — Telephone Encounter (Signed)
Pt called requesting labs be mailed to him. Done by stacey on 2/4.

## 2014-09-15 ENCOUNTER — Encounter: Payer: Self-pay | Admitting: Internal Medicine

## 2014-10-11 ENCOUNTER — Telehealth: Payer: Self-pay | Admitting: *Deleted

## 2014-10-11 MED ORDER — MESALAMINE ER 0.375 G PO CP24: ORAL_CAPSULE | ORAL | Status: DC

## 2014-10-11 NOTE — Telephone Encounter (Signed)
Sent Rx for Apriso, 0.375 gm, #360 with no refills to [facility] on 10/11/14.

## 2014-12-03 ENCOUNTER — Telehealth: Payer: Self-pay | Admitting: *Deleted

## 2014-12-03 MED ORDER — ESOMEPRAZOLE MAGNESIUM 40 MG PO CPDR: 40.0000 mg | DELAYED_RELEASE_CAPSULE | Freq: Every day | ORAL | Status: DC

## 2014-12-03 NOTE — Telephone Encounter (Signed)
Sent Rx for esomeprazole, 40 mg, #90 with 3 refills to [facility].

## 2015-01-04 ENCOUNTER — Other Ambulatory Visit: Payer: Self-pay | Admitting: Internal Medicine

## 2015-02-08 ENCOUNTER — Ambulatory Visit (HOSPITAL_BASED_OUTPATIENT_CLINIC_OR_DEPARTMENT_OTHER): Payer: Medicare Other | Admitting: Hematology

## 2015-02-08 ENCOUNTER — Other Ambulatory Visit (HOSPITAL_BASED_OUTPATIENT_CLINIC_OR_DEPARTMENT_OTHER): Payer: Medicare Other

## 2015-02-08 ENCOUNTER — Encounter: Payer: Self-pay | Admitting: Hematology

## 2015-02-08 VITALS — BP 151/80 | HR 73 | Temp 98.9°F | Resp 18 | Ht 71.5 in | Wt 190.3 lb

## 2015-02-08 DIAGNOSIS — D631 Anemia in chronic kidney disease: Secondary | ICD-10-CM

## 2015-02-08 DIAGNOSIS — D638 Anemia in other chronic diseases classified elsewhere: Secondary | ICD-10-CM

## 2015-02-08 DIAGNOSIS — Z8546 Personal history of malignant neoplasm of prostate: Secondary | ICD-10-CM

## 2015-02-08 DIAGNOSIS — K5090 Crohn's disease, unspecified, without complications: Secondary | ICD-10-CM | POA: Diagnosis not present

## 2015-02-08 DIAGNOSIS — N189 Chronic kidney disease, unspecified: Secondary | ICD-10-CM

## 2015-02-08 DIAGNOSIS — D539 Nutritional anemia, unspecified: Secondary | ICD-10-CM

## 2015-02-08 DIAGNOSIS — C7A012 Malignant carcinoid tumor of the ileum: Secondary | ICD-10-CM | POA: Diagnosis present

## 2015-02-08 LAB — CBC & DIFF AND RETIC
BASO%: 0.4 % (ref 0.0–2.0)
Basophils Absolute: 0 10*3/uL (ref 0.0–0.1)
EOS%: 2 % (ref 0.0–7.0)
Eosinophils Absolute: 0.1 10*3/uL (ref 0.0–0.5)
HCT: 35.6 % — ABNORMAL LOW (ref 38.4–49.9)
HGB: 12.6 g/dL — ABNORMAL LOW (ref 13.0–17.1)
Immature Retic Fract: 7.9 % (ref 3.00–10.60)
LYMPH%: 27.1 % (ref 14.0–49.0)
MCH: 35.3 pg — ABNORMAL HIGH (ref 27.2–33.4)
MCHC: 35.4 g/dL (ref 32.0–36.0)
MCV: 99.7 fL — ABNORMAL HIGH (ref 79.3–98.0)
MONO#: 0.2 10*3/uL (ref 0.1–0.9)
MONO%: 5 % (ref 0.0–14.0)
NEUT#: 3 10*3/uL (ref 1.5–6.5)
NEUT%: 65.5 % (ref 39.0–75.0)
Platelets: 172 10*3/uL (ref 140–400)
RBC: 3.57 10*6/uL — ABNORMAL LOW (ref 4.20–5.82)
RDW: 12.6 % (ref 11.0–14.6)
Retic %: 1.42 % (ref 0.80–1.80)
Retic Ct Abs: 50.69 10*3/uL (ref 34.80–93.90)
WBC: 4.6 10*3/uL (ref 4.0–10.3)
lymph#: 1.2 10*3/uL (ref 0.9–3.3)

## 2015-02-08 LAB — COMPREHENSIVE METABOLIC PANEL (CC13)
ALT: 24 U/L (ref 0–55)
ANION GAP: 10 meq/L (ref 3–11)
AST: 26 U/L (ref 5–34)
Albumin: 3.7 g/dL (ref 3.5–5.0)
Alkaline Phosphatase: 62 U/L (ref 40–150)
BILIRUBIN TOTAL: 0.57 mg/dL (ref 0.20–1.20)
BUN: 15.1 mg/dL (ref 7.0–26.0)
CALCIUM: 9.2 mg/dL (ref 8.4–10.4)
CO2: 20 mEq/L — ABNORMAL LOW (ref 22–29)
Chloride: 108 mEq/L (ref 98–109)
Creatinine: 1.4 mg/dL — ABNORMAL HIGH (ref 0.7–1.3)
EGFR: 48 mL/min/{1.73_m2} — ABNORMAL LOW (ref >90)
Glucose: 80 mg/dl (ref 70–140)
POTASSIUM: 4.3 meq/L (ref 3.5–5.1)
Sodium: 138 mEq/L (ref 136–145)
TOTAL PROTEIN: 6.7 g/dL (ref 6.4–8.3)

## 2015-02-08 NOTE — Progress Notes (Signed)
[place] OFFICE PROGRESS NOTE  No PCP Per Patient No address on file  DIAGNOSIS: PROSTATE CANCER, HX OF  Malignant carcinoid tumor of ileum  No chief complaint on file.   CURRENT THERAPY: Observation.   INTERVAL HISTORY: [name] 81 y.o. 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  He was last seen by me 6 months ago.    He is doing very well. He lives alone, takes care of him self and does everything at home, including mowing his yard. He has chronic arthritis, for which he takes over-the-counter pain medication sometimes. He works independently, without [employer] risk [employer]or. He denies any abdominal discomfort, diarrhea, flushing, or any other symptoms.    MEDICAL HISTORY: Past Medical History  Diagnosis Date  . Kidney stones   . History of alcohol abuse   . Malignant carcinoid tumor of ileum   . Prostate cancer   . Esophageal stricture   . GERD (gastroesophageal reflux disease)   . Crohn's     INTERIM HISTORY: has Malignant carcinoid tumor of ileum; ESOPHAGEAL STRICTURE; GERD; CROHN'S DISEASE; PROSTATE CANCER, HX OF; ALCOHOL ABUSE, HX OF; SMALL BOWEL OBSTRUCTION, HX OF; Abnormal liver function tests; Deficiency anemia; Elevated ferritin; and Food impaction of esophagus on his problem list.    ALLERGIES:  has No Known Allergies.  MEDICATIONS: has a current medication list which includes the following prescription(s): acetaminophen, allopurinol, apriso, aspirin, atorvastatin, esomeprazole, labetalol, lisinopril, multiple vitamins-minerals, and triamterene-hydrochlorothiazide.  SURGICAL HISTORY:  Past Surgical History  Procedure Laterality Date  . Appendectomy    . Tonsillectomy    . Prostatectomy    . Lithotripsy    . Small intestine surgery  05/19/02  . Esophagogastroduodenoscopy N/A 08/22/2013    Procedure: ESOPHAGOGASTRODUODENOSCOPY (EGD);  Surgeon: Jerene Bears,  MD;  Location: [facility];  Service: Endoscopy;  Laterality: N/A;   PROBLEM LIST:  1. 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 The patient underwent small bowel resection on 05/19/2002.  2. Adenocarcinoma of the prostate, status post surgery on 06/30/2003.  3. Renal insufficiency.  4. Anemia with elevated red cell indices.  5. History of Crohn's disease dating back to 10/2001.  6. COPD.  7. History of kidney stones.  8. Hypertension.  9. Dyslipidemia.  10. Gout diagnosed 10/2011. Symptoms affect the left foot, being treated with Medrol.  11. History of alcohol use.  12. Elevated ferritin 1960 on 11/12/2011. Iron saturation was 41%.  REVIEW OF SYSTEMS:   Constitutional: Denies fevers, chills or abnormal weight loss Eyes: Denies blurriness of vision Ears, nose, mouth, throat, and face: Denies mucositis or sore throat Respiratory: Denies cough, dyspnea or wheezes Cardiovascular: Denies palpitation, chest discomfort or lower extremity swelling Gastrointestinal:  Denies nausea, heartburn or change in bowel habits Skin: Denies abnormal skin rashes Lymphatics: Denies new lymphadenopathy or easy bruising Neurological:Denies numbness, tingling or new weaknesses Behavioral/Psych: Mood is stable, no new changes  All other systems were reviewed with the patient and are negative.  PHYSICAL EXAMINATION: ECOG PERFORMANCE STATUS: 1 - Symptomatic but completely ambulatory  Blood pressure 151/80, pulse 73, temperature 98.9 F (37.2 C), temperature source Oral, resp. rate 18, height 5' 11.5" (1.816 m), weight 190 lb 4.8 oz (86.32 kg), SpO2 97 %.  GENERAL:alert, no distress and comfortable SKIN: skin color, texture, turgor are normal, no rashes or significant lesions EYES: normal, Conjunctiva are pink  and non-injected, sclera clear OROPHARYNX:no exudate, no erythema and lips, buccal mucosa, and tongue normal  NECK: supple, thyroid  normal size, non-tender, without nodularity LYMPH:  no palpable lymphadenopathy in the cervical, axillary or supraclavicular LUNGS: clear to auscultation and percussion with normal breathing effort HEART: regular rate & rhythm and no murmurs and no lower extremity edema ABDOMEN:abdomen soft, non-tender and normal bowel sounds Musculoskeletal:no cyanosis of digits and no clubbing  NEURO: alert & oriented x 3 with fluent speech, no focal motor/sensory deficits   LABORATORY DATA: CBC Latest Ref Rng 02/08/2015 08/10/2014 01/11/2014  WBC 4.0 - 10.3 10e3/uL 4.6 5.5 6.1  Hemoglobin 13.0 - 17.1 g/dL 12.6(L) 12.4(L) 12.6(L)  Hematocrit 38.4 - 49.9 % 35.6(L) 37.7(L) 36.9(L)  Platelets 140 - 400 10e3/uL 172 211 188    CMP Latest Ref Rng 02/08/2015 08/10/2014 01/11/2014  Glucose 70 - 140 mg/dl 80 76 83  BUN 7.0 - 26.0 mg/dL 15.1 13.5 14.7  Creatinine 0.7 - 1.3 mg/dL 1.4(H) 1.3 1.5(H)  Sodium 136 - 145 mEq/L 138 139 139  Potassium 3.5 - 5.1 mEq/L 4.3 4.3 4.2  Chloride 98 - 107 mEq/L - - -  CO2 22 - 29 mEq/L 20(L) 22 20(L)  Calcium 8.4 - 10.4 mg/dL 9.2 9.4 9.4  Total Protein 6.4 - 8.3 g/dL 6.7 6.9 7.1  Total Bilirubin 0.20 - 1.20 mg/dL 0.57 0.66 0.52  Alkaline Phos 40 - 150 U/L 62 50 54  AST 5 - 34 U/L 26 20 31   ALT 0 - 55 U/L 24 18 28       RADIOGRAPHIC STUDIES: No results found.  ASSESSMENT: Ronald [name] 81 y.o. male with a history of PROSTATE CANCER, HX OF  Malignant carcinoid tumor of ileum   PLAN:   1. Prostate Cancer. --He is 12 years out of his surgery. Clinically doing very well. PSA was normal 6 months ago, today's results are still pending. There is no evidence of recurrence at this point. -Continue monitoring.  2. Carcinoid tumor.  --At this point, there does not appear to be any evidence for recurrence of his carcinoid tumor now more than 10 years for the carcinoid tumor. He denies any symptoms of diarrhea or flushing.  -Continue monitoring clinically  3. Anemia of  chronic disease (CKD and Crohn's disease) -He has a mild anemia, normal iron and TIBC in the past, elevated ferritin. This is likely anemia of chronic disease secondary to CKD and Crohn's disease. -His hemoglobin is 12.6 today. Continue monitoring.  4. Questionable secondary hemochromatosis.  -- He has elevated ferritin level, likely secondary to his Crohn's disease. We checked hemochromatosis gene mutation to ruled out hemochromatosis, result is pending  -We'll continue monitoring.  4. Crohn's disease --Management per GI (Dr. [name])  5. Follow-up. -- We will plan to have Mr. Neeb return in 6 months with repeated CBC, chemistries, iron studies, and a PSA -I will mail him the lab results from today when they return  All questions were answered. The patient knows to call the clinic with any problems, questions or concerns. We can certainly see the patient much sooner if necessary.  I spent 15 minutes counseling the patient face to face. The total time spent in the appointment was 25 minutes.    Truitt Merle, MD 02/08/2015  1:21 PM

## 2015-02-12 LAB — PSA, TOTAL AND FREE
PSA, Free: <0.01 ng/mL
PSA: <0.01 ng/mL (ref <=4.00)

## 2015-02-12 LAB — HEMOCHROMATOSIS DNA-PCR(C282Y,H63D)

## 2015-02-15 ENCOUNTER — Telehealth: Payer: Self-pay | Admitting: *Deleted

## 2015-02-15 NOTE — Telephone Encounter (Signed)
Copy of labs from 02/08/15 mailed to pt per Dr. Lewayne Bunting request.

## 2015-05-24 ENCOUNTER — Other Ambulatory Visit: Payer: Self-pay | Admitting: Hematology

## 2015-05-25 ENCOUNTER — Telehealth: Payer: Self-pay | Admitting: Hematology

## 2015-05-25 ENCOUNTER — Telehealth: Payer: Self-pay | Admitting: *Deleted

## 2015-05-25 NOTE — Telephone Encounter (Signed)
Spoke with pt and was informed that pt never received lab results mailed to him on 02/15/15.  Remailed lab results from 02/08/15 to pt's home as per pt's request.

## 2015-05-25 NOTE — Telephone Encounter (Signed)
Returned patient call re when f/u will be. pof sent yesterday for lab/fu mid to late January. Spoke with patient re appointments for 08/08/15. Message left for desk nurse regarding patient wanting results of last lab and copy sent to him.

## 2015-07-05 ENCOUNTER — Other Ambulatory Visit: Payer: Self-pay

## 2015-07-05 MED ORDER — MESALAMINE ER 0.375 G PO CP24: ORAL_CAPSULE | ORAL | Status: DC

## 2015-08-08 ENCOUNTER — Other Ambulatory Visit: Payer: Medicare Other

## 2015-08-08 ENCOUNTER — Encounter: Payer: Self-pay | Admitting: Hematology

## 2015-08-08 ENCOUNTER — Encounter: Payer: Medicare Other | Admitting: Hematology

## 2015-08-08 NOTE — Progress Notes (Signed)
No show  This encounter was created in error - please disregard.

## 2015-08-09 ENCOUNTER — Telehealth: Payer: Self-pay | Admitting: Hematology

## 2015-08-09 NOTE — Telephone Encounter (Signed)
Spoke with patient and r/s missed appointment on 1/23 for 1/26.

## 2015-08-11 ENCOUNTER — Telehealth: Payer: Self-pay | Admitting: Hematology

## 2015-08-11 ENCOUNTER — Ambulatory Visit (HOSPITAL_BASED_OUTPATIENT_CLINIC_OR_DEPARTMENT_OTHER): Payer: Medicare Other | Admitting: Hematology

## 2015-08-11 ENCOUNTER — Other Ambulatory Visit (HOSPITAL_BASED_OUTPATIENT_CLINIC_OR_DEPARTMENT_OTHER): Payer: Medicare Other

## 2015-08-11 ENCOUNTER — Encounter: Payer: Self-pay | Admitting: Hematology

## 2015-08-11 VITALS — BP 176/84 | HR 72 | Temp 97.9°F | Resp 18 | Ht 71.5 in | Wt 191.4 lb

## 2015-08-11 DIAGNOSIS — R7989 Other specified abnormal findings of blood chemistry: Secondary | ICD-10-CM | POA: Diagnosis not present

## 2015-08-11 DIAGNOSIS — C7A012 Malignant carcinoid tumor of the ileum: Secondary | ICD-10-CM | POA: Diagnosis present

## 2015-08-11 DIAGNOSIS — Z8546 Personal history of malignant neoplasm of prostate: Secondary | ICD-10-CM | POA: Diagnosis not present

## 2015-08-11 DIAGNOSIS — D631 Anemia in chronic kidney disease: Secondary | ICD-10-CM | POA: Diagnosis not present

## 2015-08-11 DIAGNOSIS — D539 Nutritional anemia, unspecified: Secondary | ICD-10-CM

## 2015-08-11 DIAGNOSIS — K5090 Crohn's disease, unspecified, without complications: Secondary | ICD-10-CM

## 2015-08-11 DIAGNOSIS — N189 Chronic kidney disease, unspecified: Secondary | ICD-10-CM

## 2015-08-11 DIAGNOSIS — D638 Anemia in other chronic diseases classified elsewhere: Secondary | ICD-10-CM

## 2015-08-11 LAB — COMPREHENSIVE METABOLIC PANEL
ALT: 21 U/L (ref 0–55)
AST: 23 U/L (ref 5–34)
Albumin: 3.6 g/dL (ref 3.5–5.0)
Alkaline Phosphatase: 53 U/L (ref 40–150)
Anion Gap: 10 mEq/L (ref 3–11)
BUN: 11.7 mg/dL (ref 7.0–26.0)
CHLORIDE: 110 meq/L — AB (ref 98–109)
CO2: 23 meq/L (ref 22–29)
Calcium: 8.9 mg/dL (ref 8.4–10.4)
Creatinine: 1.3 mg/dL (ref 0.7–1.3)
EGFR: 54 mL/min/{1.73_m2} — AB (ref >90)
GLUCOSE: 78 mg/dL (ref 70–140)
POTASSIUM: 4 meq/L (ref 3.5–5.1)
SODIUM: 142 meq/L (ref 136–145)
Total Bilirubin: 0.47 mg/dL (ref 0.20–1.20)
Total Protein: 6.7 g/dL (ref 6.4–8.3)

## 2015-08-11 LAB — CBC & DIFF AND RETIC
BASO%: 0.5 % (ref 0.0–2.0)
Basophils Absolute: 0 10*3/uL (ref 0.0–0.1)
EOS ABS: 0.2 10*3/uL (ref 0.0–0.5)
EOS%: 2.7 % (ref 0.0–7.0)
HCT: 34 % — ABNORMAL LOW (ref 38.4–49.9)
HGB: 12 g/dL — ABNORMAL LOW (ref 13.0–17.1)
Immature Retic Fract: 10.9 % — ABNORMAL HIGH (ref 3.00–10.60)
LYMPH#: 1.4 10*3/uL (ref 0.9–3.3)
LYMPH%: 25.5 % (ref 14.0–49.0)
MCH: 35.3 pg — AB (ref 27.2–33.4)
MCHC: 35.3 g/dL (ref 32.0–36.0)
MCV: 100 fL — ABNORMAL HIGH (ref 79.3–98.0)
MONO#: 0.2 10*3/uL (ref 0.1–0.9)
MONO%: 4.3 % (ref 0.0–14.0)
NEUT%: 67 % (ref 39.0–75.0)
NEUTROS ABS: 3.8 10*3/uL (ref 1.5–6.5)
Platelets: 179 10*3/uL (ref 140–400)
RBC: 3.4 10*6/uL — ABNORMAL LOW (ref 4.20–5.82)
RDW: 13.2 % (ref 11.0–14.6)
RETIC %: 1.88 % — AB (ref 0.80–1.80)
Retic Ct Abs: 63.92 10*3/uL (ref 34.80–93.90)
WBC: 5.6 10*3/uL (ref 4.0–10.3)

## 2015-08-11 LAB — FERRITIN

## 2015-08-11 NOTE — Progress Notes (Signed)
[place] OFFICE PROGRESS NOTE  DIAGNOSIS: History of Malignant carcinoid tumor of ileum ([place])  PROSTATE CANCER, HX OF  Chief complain: follow up   CURRENT THERAPY: Observation.   PROBLEM LIST:  1. 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 The patient underwent small bowel resection on 05/19/2002.  2. Adenocarcinoma of the prostate, status post surgery on 06/30/2003.  3. Renal insufficiency.  4. Anemia with elevated red cell indices.  5. History of Crohn's disease dating back to 10/2001.  6. COPD.  7. History of kidney stones.  8. Hypertension.  9. Dyslipidemia.  10. Gout diagnosed 10/2011. Symptoms affect the left foot, being treated with Medrol.  11. History of alcohol use.  12. Elevated ferritin 1960 on 11/12/2011. Iron saturation was 41%.  INTERVAL HISTORY: Ronald [name] 82 y.o. 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  He was last seen by me 6 months ago.   He is doing well overall. He denies any ED visit or hospitalization in the past 6 months. He lives independently, denies any pain except his mild arthritis pain, or other symptoms.    MEDICAL HISTORY: Past Medical History  Diagnosis Date  . Kidney stones   . History of alcohol abuse   . Malignant carcinoid tumor of ileum ([place])   . Prostate cancer ([place])   . Esophageal stricture   . GERD (gastroesophageal reflux disease)   . Crohn's     INTERIM HISTORY: has Malignant carcinoid tumor of ileum ([place]); ESOPHAGEAL STRICTURE; GERD; CROHN'S DISEASE; PROSTATE CANCER, HX OF; ALCOHOL ABUSE, HX OF; SMALL BOWEL OBSTRUCTION, HX OF; Abnormal liver function tests; Deficiency anemia; Elevated ferritin; and Food impaction of esophagus on his problem list.    ALLERGIES:  has No Known Allergies.  MEDICATIONS: has a current medication list which includes the following  prescription(s): acetaminophen, allopurinol, aspirin, atorvastatin, esomeprazole, labetalol, lisinopril, mesalamine, montelukast, multiple vitamins-minerals, and triamterene-hydrochlorothiazide.  SURGICAL HISTORY:  Past Surgical History  Procedure Laterality Date  . Appendectomy    . Tonsillectomy    . Prostatectomy    . Lithotripsy    . Small intestine surgery  05/19/02  . Esophagogastroduodenoscopy N/A 08/22/2013    Procedure: ESOPHAGOGASTRODUODENOSCOPY (EGD);  Surgeon: Jerene Bears, MD;  Location: [facility];  Service: Endoscopy;  Laterality: N/A;   PROBLEM LIST:  1. 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 The patient underwent small bowel resection on 05/19/2002.  2. Adenocarcinoma of the prostate, status post surgery on 06/30/2003.  3. Renal insufficiency.  4. Anemia with elevated red cell indices.  5. History of Crohn's disease dating back to 10/2001.  6. COPD.  7. History of kidney stones.  8. Hypertension.  9. Dyslipidemia.  10. Gout diagnosed 10/2011. Symptoms affect the left foot, being treated with Medrol.  11. History of alcohol use.  12. Elevated ferritin 1960 on 11/12/2011. Iron saturation was 41%.  REVIEW OF SYSTEMS:   Constitutional: Denies fevers, chills or abnormal weight loss Eyes: Denies blurriness of vision Ears, nose, mouth, throat, and face: Denies mucositis or sore throat Respiratory: Denies cough, dyspnea or wheezes Cardiovascular: Denies palpitation, chest discomfort or lower extremity swelling Gastrointestinal:  Denies nausea, heartburn or change in bowel habits Skin: Denies abnormal skin rashes Lymphatics: Denies new lymphadenopathy or easy bruising Neurological:Denies numbness, tingling or new weaknesses  Behavioral/Psych: Mood is stable, no new changes  All other systems were reviewed with the patient and are negative.  PHYSICAL EXAMINATION: ECOG PERFORMANCE STATUS: 1 - Symptomatic but  completely ambulatory  Blood pressure 176/84, pulse 72, temperature 97.9 F (36.6 C), temperature source Oral, resp. rate 18, height 5' 11.5" (1.816 m), weight 191 lb 6.4 oz (86.818 kg), SpO2 99 %.  GENERAL:alert, no distress and comfortable SKIN: skin color, texture, turgor are normal, no rashes or significant lesions EYES: normal, Conjunctiva are pink and non-injected, sclera clear OROPHARYNX:no exudate, no erythema and lips, buccal mucosa, and tongue normal  NECK: supple, thyroid normal size, non-tender, without nodularity LYMPH:  no palpable lymphadenopathy in the cervical, axillary or supraclavicular LUNGS: clear to auscultation and percussion with normal breathing effort HEART: regular rate & rhythm and no murmurs and no lower extremity edema ABDOMEN:abdomen soft, non-tender and normal bowel sounds Musculoskeletal:no cyanosis of digits and no clubbing  NEURO: alert & oriented x 3 with fluent speech, no focal motor/sensory deficits   LABORATORY DATA: CBC Latest Ref Rng 08/11/2015 02/08/2015 08/10/2014  WBC 4.0 - 10.3 10e3/uL 5.6 4.6 5.5  Hemoglobin 13.0 - 17.1 g/dL 12.0(L) 12.6(L) 12.4(L)  Hematocrit 38.4 - 49.9 % 34.0(L) 35.6(L) 37.7(L)  Platelets 140 - 400 10e3/uL 179 172 211    CMP Latest Ref Rng 08/11/2015 02/08/2015 08/10/2014  Glucose 70 - 140 mg/dl 78 80 76  BUN 7.0 - 26.0 mg/dL 11.7 15.1 13.5  Creatinine 0.7 - 1.3 mg/dL 1.3 1.4(H) 1.3  Sodium 136 - 145 mEq/L 142 138 139  Potassium 3.5 - 5.1 mEq/L 4.0 4.3 4.3  Chloride 98 - 107 mEq/L - - -  CO2 22 - 29 mEq/L 23 20(L) 22  Calcium 8.4 - 10.4 mg/dL 8.9 9.2 9.4  Total Protein 6.4 - 8.3 g/dL 6.7 6.7 6.9  Total Bilirubin 0.20 - 1.20 mg/dL 0.47 0.57 0.66  Alkaline Phos 40 - 150 U/L 53 62 50  AST 5 - 34 U/L 23 26 20   ALT 0 - 55 U/L 21 24 18    Results for LOVELL, NUTTALL (MRN [id number]) as of 08/14/2015 21:02  Ref. Range 01/11/2014 12:58 08/10/2014 12:30 08/11/2015 12:27  Iron Latest Ref Range: 42-163 ug/dL 95    UIBC Latest Ref  Range: 117-376 ug/dL 123    TIBC Latest Ref Range: 202-409 ug/dL 218    %SAT Latest Ref Range: 20-55 % 44    Ferritin Latest Ref Range: 22-316 ng/ml 1,970 (H) 1,803 (H) 1,605 (H)   PSA, total and free  Status: Finalresult Visible to patient:  Not Released Nextappt: 08/24/2015 at 11:00 AM in Gastroenterology Doran Stabler, MD) Dx:  PROSTATE CANCER, HX OF; Malignant car...         Ref Range 3d ago    Prostate Specific Ag, Serum 0.0 - 4.0 ng/mL <0.1         Hemochromatosis DNA, PCR  Status: Finalresult Visible to patient:  MyChart Nextappt: 08/24/2015 at 11:00 AM in Gastroenterology Doran Stabler, MD) Dx:  Anemia of chronic disease          40moago    DNA Mutation Analysis REPORT   Comments: DNA Mutation Analysis              RESULT: HETEROZYGOUS FOR THE H63D MUTATION          RADIOGRAPHIC STUDIES: No results found.  ASSESSMENT: HRochele PagesAllred 82y.o. male with a history of   PLAN:   1. Prostate Cancer, diagnosed  in 2004 --He is 12 years out of his surgery. Clinically doing very well. PSA remains to be <0.1. There is no evidence of recurrence at this point. -Continue monitoring.  2. Carcinoid tumor, diagnosed in 2003  -He is clinically doing well, asymptomatic.  --At this point, there does not appear to be any evidence for recurrence of his carcinoid tumor. -He is 13 years out for the carcinoid tumor, although late recurrence can happen, his risk is pretty small now  -Continue monitoring clinically  3. Anemia of chronic disease (CKD and Crohn's disease) -He has a mild anemia, normal iron and TIBC in the past, elevated ferritin. This is likely anemia of chronic disease secondary to CKD and Crohn's disease. -His hemoglobin is 12.0 today, overall stable. Continue monitoring.  4. Questionable secondary hemochromatosis.  -- He has elevated ferritin level, likely secondary to his Crohn's disease.  -We checked  hemochromatosis gene mutation, which showed H63D mutation heterozygous. This is unlikely hemochromatosis. I reviewed the result with him today.  -We'll continue monitoring, his ferritin level is slightly trending down   5. Crohn's disease --Management per GI   Follow-up. -- Pt refers to be followed every 6 months, instead of once a year. We will plan to have Mr. Kirkendoll return in 6 months with repeated CBC, chemistries, iron studies, and a PSA  All questions were answered. The patient knows to call the clinic with any problems, questions or concerns. We can certainly see the patient much sooner if necessary.  I spent 20 minutes counseling the patient face to face. The total time spent in the appointment was 25 minutes.    Truitt Merle, MD 08/11/2015  2:00 PM

## 2015-08-11 NOTE — Telephone Encounter (Signed)
Pt here today, asks that we mail 6 mos appt. Mailed appt for 7/27 @ 12.30pm.

## 2015-08-12 LAB — PSA, TOTAL AND FREE

## 2015-08-15 ENCOUNTER — Telehealth: Payer: Self-pay | Admitting: *Deleted

## 2015-08-15 NOTE — Telephone Encounter (Signed)
Per Dr. Burr Medico; notified pt that PSA is normal.  Pt verbalized understanding and expressed appreciation for call.

## 2015-08-24 ENCOUNTER — Ambulatory Visit (INDEPENDENT_AMBULATORY_CARE_PROVIDER_SITE_OTHER): Payer: Medicare Other | Admitting: Gastroenterology

## 2015-08-24 ENCOUNTER — Encounter: Payer: Self-pay | Admitting: Gastroenterology

## 2015-08-24 VITALS — BP 180/90 | HR 64 | Ht 71.25 in | Wt 190.0 lb

## 2015-08-24 DIAGNOSIS — K5000 Crohn's disease of small intestine without complications: Secondary | ICD-10-CM | POA: Diagnosis not present

## 2015-08-24 DIAGNOSIS — K222 Esophageal obstruction: Secondary | ICD-10-CM

## 2015-08-24 DIAGNOSIS — K219 Gastro-esophageal reflux disease without esophagitis: Secondary | ICD-10-CM | POA: Diagnosis not present

## 2015-08-24 NOTE — Patient Instructions (Addendum)
See me in one year , but call sooner if you need to see me.  Thank you for choosing [facility]   Dr Wilfrid Lund III

## 2015-08-24 NOTE — Progress Notes (Signed)
Gastroenterology and Hepatology Consult Note:  History: Ronald [name] 08/24/2015  Referring physician: Leota Jacobsen, MD  Reason for consult/chief complaint: Crohn's Disease Dr Nichola Sizer note from 06/30/13: This is a 82 year old white male with Crohn's disease of the terminal ileum, positive IBD markers and a history of a malignant carcinoid resected in November 2003. He also has a family history of colon cancer in his father. His last upper endoscopy for esophageal stricture was in 2009 when he was also dilated. His last colonoscopy was in May 2009. He has been followed by Dr.Murinson. Brodie in 08/15/2010:  He still takes Asacol 400 mg 2 tablets twice a day and does not really want to stopp takin it, although there has been some question whether he ever had Crohn's disease.. We will obtain IBD markers today to see if he has a IBD antibodies.   Subjective   HPI:  Ronald [name] is a new patient to me, previously seen by Dr. [name]. As noted above, he carries an unclear diagnosis of Crohn's disease. He had no visible disease on his last colonoscopy in 2009, but he has apparently been reluctant to stop his mesalamine. He denies abdominal pain diarrhea anorexia or weight loss. He had prior food impactions and required esophageal dilation multiple times, most recently March 2015. He denies rectal bleeding.  ROS:  Review of Systems Denies chest pain or dyspnea  Past Medical History: Past Medical History  Diagnosis Date  . Kidney stones   . History of alcohol abuse   . 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 Prostate cancer ([place])   . Esophageal stricture   . GERD (gastroesophageal reflux disease)   . Crohn's      Past Surgical History: Past Surgical History  Procedure Laterality Date  . Appendectomy    . Tonsillectomy    . Prostatectomy    . Lithotripsy    . Small intestine surgery  05/19/02  . Esophagogastroduodenoscopy N/A 08/22/2013    Procedure: ESOPHAGOGASTRODUODENOSCOPY  (EGD);  Surgeon: Jerene Bears, MD;  Location: [facility];  Service: Endoscopy;  Laterality: N/A;     Family History: Family History  Problem Relation Age of Onset  . Breast cancer Sister   . Colon cancer Father   . Heart disease Brother     x 2  . Heart disease Sister   . Cirrhosis Brother     alcohol releated  . Diabetes Sister   . Hypertension Maternal Grandfather     Social History: Social History   Social History  . Marital Status: Widowed    Spouse Name: N/A  . Number of Children: 2  . Years of Education: N/A   Occupational History  . retired    Social History Main Topics  . Smoking status: Former Smoker    Types: Cigarettes    Quit date: 07/16/1970  . Smokeless tobacco: Current User    Types: Chew  . Alcohol Use: 16.8 oz/week    28 Cans of beer per week     Comment: 3-4 beers every day per patient.   . Drug Use: No  . Sexual Activity: Not Asked   Other Topics Concern  . None   Social History Narrative    Allergies: No Known Allergies  Outpatient Meds: Current Outpatient Prescriptions  Medication Sig Dispense Refill  . acetaminophen (TYLENOL ARTHRITIS PAIN) 650 MG CR tablet Take 650 mg by mouth daily as needed.      Marland Kitchen allopurinol (ZYLOPRIM) 300 MG tablet Take 150  mg by mouth daily.    Marland Kitchen aspirin 81 MG tablet Take 81 mg by mouth daily.    Marland Kitchen atorvastatin (LIPITOR) 80 MG tablet Take 80 mg by mouth at bedtime.    Marland Kitchen esomeprazole (NEXIUM) 40 MG capsule Take 1 capsule (40 mg total) by mouth daily. 90 capsule 3  . labetalol (NORMODYNE) 100 MG tablet Take 100 mg by mouth 2 (two) times daily.      Marland Kitchen lisinopril (PRINIVIL,ZESTRIL) 20 MG tablet Take 20 mg by mouth 2 (two) times daily.     . mesalamine (APRISO) 0.375 G 24 hr capsule TAKE 4 CAPSULES BY MOUTH EVERY MORNING 360 capsule 0  . montelukast (SINGULAIR) 10 MG tablet     . Multiple Vitamins-Minerals (CENTRUM SILVER ULTRA MENS PO) Take 1 tablet by mouth daily.    Marland Kitchen triamterene-hydrochlorothiazide  (MAXZIDE-25) 37.5-25 MG per tablet Take 1 tablet by mouth daily.      No current facility-administered medications for this visit.      ___________________________________________________________________ Objective  Exam:  BP 180/90 mmHg  Pulse 64  Ht 5' 11.25" (1.81 m)  Wt 86.183 kg (190 lb)  BMI 26.31 kg/m2  General: this is a well-appearing elderly white male patient in no acute distress  Eyes: sclera anicteric, no redness  ENT: oral mucosa moist without lesions, no cervical or supraclavicular lymphadenopathy, fair dentition  CV: RRR without murmur, S1/S2, no JVD,, no peripheral edema  Resp: clear to auscultation bilaterally, normal RR and effort noted  GI: soft, no tenderness, with active bowel sounds. No guarding or palpable organomegaly noted. Laparotomy scar, no hernia  Skin; warm and dry, no rash or jaundice noted  Neuro: awake, alert and oriented x 3. Normal gross motor function and fluent speech  Labs:    Radiologic Studies:    Assessment: Crohn's small bowel - stable Esophageal stricture, stable.  No dysphagia at present. GERD - stable on current meds  Plan: Will refill meds, see me in 1 year or sooner as needed.   Thank you for the courtesy of this consult.  Please call me with any questions or concerns.  Nelida Meuse III

## 2015-08-30 ENCOUNTER — Telehealth: Payer: Self-pay

## 2015-08-30 MED ORDER — ESOMEPRAZOLE MAGNESIUM 40 MG PO CPDR: 40.0000 mg | DELAYED_RELEASE_CAPSULE | Freq: Every day | ORAL | Status: DC

## 2015-08-30 NOTE — Telephone Encounter (Signed)
Incoming fax request from [facility] for Nexium 40 mg 1 by mouth daily. 90 tabs with 3 refills. Pt last seen in clinic on 08-24-2015. Please advise.

## 2015-08-30 NOTE — Telephone Encounter (Signed)
Please refill one capsule daily for 90 days, 3 refills. He will see me in a year or sooner if needed.

## 2015-08-30 NOTE — Telephone Encounter (Signed)
Medication refilled as directed from Dr Loletha Carrow.

## 2015-09-26 ENCOUNTER — Telehealth: Payer: Self-pay | Admitting: *Deleted

## 2015-09-26 MED ORDER — MESALAMINE ER 0.375 G PO CP24: ORAL_CAPSULE | ORAL | Status: DC

## 2015-09-26 NOTE — Telephone Encounter (Signed)
Rx sent 

## 2015-09-26 NOTE — Telephone Encounter (Signed)
Dr Loletha Carrow- Patient requests refills of Apriso. Per your last office note 08-24-15, patient was taking Asacol 400 mg 2 tablets twice daily and you had wanted to get IBD markers since there was some question as to whether the patient has Crohns. I do not find where patient ever had these. Please clarify for me whether you want patient to take Apriso (as it looks like he has been taking) or Asacol. Also, what labs would you like and I will have patient to come get those done. Thank you!

## 2015-09-26 NOTE — Telephone Encounter (Signed)
Hi, thanks for your note.  I think the initial part of my note to which you refer was part of Dr Nichola Sizer last note that I referenced. (I should have put quotes around it).  At any rate, I am not planning to get any labs, and I would like the patient to continue whatever med he is on at its current dose. Thanks

## 2016-02-09 ENCOUNTER — Ambulatory Visit (HOSPITAL_BASED_OUTPATIENT_CLINIC_OR_DEPARTMENT_OTHER): Payer: Medicare Other | Admitting: Hematology

## 2016-02-09 ENCOUNTER — Encounter: Payer: Self-pay | Admitting: Hematology

## 2016-02-09 ENCOUNTER — Other Ambulatory Visit (HOSPITAL_BASED_OUTPATIENT_CLINIC_OR_DEPARTMENT_OTHER): Payer: Medicare Other

## 2016-02-09 VITALS — BP 194/88 | HR 70 | Temp 98.5°F | Resp 18 | Ht 71.25 in | Wt 190.0 lb

## 2016-02-09 DIAGNOSIS — Z85060 Personal history of malignant carcinoid tumor of small intestine: Secondary | ICD-10-CM

## 2016-02-09 DIAGNOSIS — C7A012 Malignant carcinoid tumor of the ileum: Secondary | ICD-10-CM

## 2016-02-09 DIAGNOSIS — D638 Anemia in other chronic diseases classified elsewhere: Secondary | ICD-10-CM

## 2016-02-09 DIAGNOSIS — Z8546 Personal history of malignant neoplasm of prostate: Secondary | ICD-10-CM

## 2016-02-09 LAB — COMPREHENSIVE METABOLIC PANEL
ALBUMIN: 3.6 g/dL (ref 3.5–5.0)
ALK PHOS: 48 U/L (ref 40–150)
ALT: 23 U/L (ref 0–55)
AST: 29 U/L (ref 5–34)
Anion Gap: 10 mEq/L (ref 3–11)
BILIRUBIN TOTAL: 0.51 mg/dL (ref 0.20–1.20)
BUN: 13.1 mg/dL (ref 7.0–26.0)
CALCIUM: 8.8 mg/dL (ref 8.4–10.4)
CO2: 21 mEq/L — ABNORMAL LOW (ref 22–29)
CREATININE: 1.2 mg/dL (ref 0.7–1.3)
Chloride: 109 mEq/L (ref 98–109)
EGFR: 54 mL/min/{1.73_m2} — ABNORMAL LOW (ref >90)
GLUCOSE: 77 mg/dL (ref 70–140)
Potassium: 4 mEq/L (ref 3.5–5.1)
SODIUM: 139 meq/L (ref 136–145)
TOTAL PROTEIN: 6.5 g/dL (ref 6.4–8.3)

## 2016-02-09 LAB — CBC & DIFF AND RETIC
BASO%: 0.6 % (ref 0.0–2.0)
BASOS ABS: 0 10*3/uL (ref 0.0–0.1)
EOS ABS: 0.1 10*3/uL (ref 0.0–0.5)
EOS%: 1.7 % (ref 0.0–7.0)
HEMATOCRIT: 34.8 % — AB (ref 38.4–49.9)
HGB: 12.1 g/dL — ABNORMAL LOW (ref 13.0–17.1)
IMMATURE RETIC FRACT: 9.5 % (ref 3.00–10.60)
LYMPH%: 26.8 % (ref 14.0–49.0)
MCH: 35 pg — ABNORMAL HIGH (ref 27.2–33.4)
MCHC: 34.8 g/dL (ref 32.0–36.0)
MCV: 100.6 fL — ABNORMAL HIGH (ref 79.3–98.0)
MONO#: 0.3 10*3/uL (ref 0.1–0.9)
MONO%: 6.7 % (ref 0.0–14.0)
NEUT%: 64.2 % (ref 39.0–75.0)
NEUTROS ABS: 3.1 10*3/uL (ref 1.5–6.5)
Platelets: 164 10*3/uL (ref 140–400)
RBC: 3.46 10*6/uL — ABNORMAL LOW (ref 4.20–5.82)
RDW: 12.6 % (ref 11.0–14.6)
RETIC %: 1.77 % (ref 0.80–1.80)
Retic Ct Abs: 61.24 10*3/uL (ref 34.80–93.90)
WBC: 4.8 10*3/uL (ref 4.0–10.3)
lymph#: 1.3 10*3/uL (ref 0.9–3.3)

## 2016-02-09 NOTE — Progress Notes (Signed)
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   PROBLEM LIST:  1. 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 The patient underwent small bowel resection on 05/19/2002.  2. Adenocarcinoma of the prostate, status post surgery on 06/30/2003.  3. Renal insufficiency.  4. Anemia of chronic disease  5. History of Crohn's disease dating back to 10/2001.  6. COPD.  7. History of kidney stones.  8. Hypertension.  9. Dyslipidemia.  10. Gout diagnosed 10/2011. Symptoms affect the left foot, being treated with Medrol.  11. History of alcohol use.  12. Elevated ferritin 1960 on 11/12/2011. Iron saturation was 41%.  INTERVAL HISTORY: Ronald [name] 82 y.o. 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  He was last seen by me 6 months ago.   He is doing well overall. He denies any pain, dyspnea, or other symptoms. His blood pressure was quite high when he checked being, but he states his blood pressure has been normal at home when he checks. He denies any headaches or other neurological symptoms. He lives independently, remains to be physically active, likes gardening.  MEDICAL HISTORY: Past Medical History:  Diagnosis Date  . Crohn's   . Esophageal stricture   . GERD (gastroesophageal reflux disease)   . History of alcohol abuse   . Kidney stones   . 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    ALLERGIES:  has No Known Allergies.  MEDICATIONS: has a current medication list which includes the following prescription(s): acetaminophen, allopurinol, aspirin, atorvastatin, esomeprazole, labetalol, lisinopril, mesalamine, montelukast, multiple vitamins-minerals, and triamterene-hydrochlorothiazide.  SURGICAL HISTORY:  Past Surgical History:  Procedure Laterality Date  . APPENDECTOMY    . ESOPHAGOGASTRODUODENOSCOPY N/A 08/22/2013   Procedure: ESOPHAGOGASTRODUODENOSCOPY (EGD);  Surgeon: Ronald Bears, MD;  Location: [facility];  Service: Endoscopy;  Laterality: N/A;  . LITHOTRIPSY    . PROSTATECTOMY    . SMALL INTESTINE SURGERY  05/19/02  . TONSILLECTOMY      REVIEW OF SYSTEMS:   Constitutional: Denies fevers, chills or abnormal weight loss Eyes: Denies blurriness of vision Ears, nose, mouth, throat, and face: Denies mucositis or sore throat Respiratory: Denies cough, dyspnea or wheezes Cardiovascular: Denies palpitation, chest discomfort or lower extremity swelling Gastrointestinal:  Denies nausea, heartburn or change in bowel habits Skin: Denies abnormal skin rashes Lymphatics: Denies new lymphadenopathy or easy bruising Neurological:Denies numbness, tingling or new weaknesses Behavioral/Psych: Mood is stable, no new changes  All other systems were reviewed with the patient and are negative.  PHYSICAL EXAMINATION: ECOG PERFORMANCE STATUS: 1 - Symptomatic but completely ambulatory  Blood pressure (!) 194/88, pulse 70, temperature 98.5 F (36.9 C), temperature source Oral, resp. rate 18, height 5' 11.25" (1.81 m), weight 190 lb (86.2 kg), SpO2 100 %.  GENERAL:alert, no distress and comfortable SKIN: skin color, texture, turgor are normal, no rashes or significant lesions EYES: normal, Conjunctiva  are pink and non-injected, sclera clear OROPHARYNX:no exudate, no erythema and lips, buccal mucosa, and  tongue normal  NECK: supple, thyroid normal size, non-tender, without nodularity LYMPH:  no palpable lymphadenopathy in the cervical, axillary or supraclavicular LUNGS: clear to auscultation and percussion with normal breathing effort HEART: regular rate & rhythm and no murmurs and no lower extremity edema ABDOMEN:abdomen soft, non-tender and normal bowel sounds Musculoskeletal:no cyanosis of digits and no clubbing  NEURO: alert & oriented x 3 with fluent speech, no focal motor/sensory deficits   LABORATORY DATA: CBC Latest Ref Rng & Units 02/09/2016 08/11/2015 02/08/2015  WBC 4.0 - 10.3 10e3/uL 4.8 5.6 4.6  Hemoglobin 13.0 - 17.1 g/dL 12.1(L) 12.0(L) 12.6(L)  Hematocrit 38.4 - 49.9 % 34.8(L) 34.0(L) 35.6(L)  Platelets 140 - 400 10e3/uL 164 179 172    CMP Latest Ref Rng & Units 02/09/2016 08/11/2015 02/08/2015  Glucose 70 - 140 mg/dl 77 78 80  BUN 7.0 - 26.0 mg/dL 13.1 11.7 15.1  Creatinine 0.7 - 1.3 mg/dL 1.2 1.3 1.4(H)  Sodium 136 - 145 mEq/L 139 142 138  Potassium 3.5 - 5.1 mEq/L 4.0 4.0 4.3  Chloride 98 - 107 mEq/L - - -  CO2 22 - 29 mEq/L 21(L) 23 20(L)  Calcium 8.4 - 10.4 mg/dL 8.8 8.9 9.2  Total Protein 6.4 - 8.3 g/dL 6.5 6.7 6.7  Total Bilirubin 0.20 - 1.20 mg/dL 0.51 0.47 0.57  Alkaline Phos 40 - 150 U/L 48 53 62  AST 5 - 34 U/L 29 23 26   ALT 0 - 55 U/L 23 21 24    Results for Ronald [name] (MRN [id number]) as of 08/14/2015 21:02  Ref. Range 01/11/2014 12:58 08/10/2014 12:30 08/11/2015 12:27  Iron Latest Ref Range: 42-163 ug/dL 95    UIBC Latest Ref Range: 117-376 ug/dL 123    TIBC Latest Ref Range: 202-409 ug/dL 218    %SAT Latest Ref Range: 20-55 % 44    Ferritin Latest Ref Range: 22-316 ng/ml 1,970 (H) 1,803 (H) 1,605 (H)     Hemochromatosis DNA, PCR  Status: Finalresult Visible to patient:  MyChart Nextappt: 08/24/2015 at 11:00 AM in Gastroenterology Ronald Stabler, MD) Dx:  Anemia of chronic disease          15moago    DNA Mutation Analysis REPORT    Comments: DNA Mutation Analysis              RESULT: HETEROZYGOUS FOR THE H63D MUTATION          RADIOGRAPHIC STUDIES: No results found.  ASSESSMENT: Ronald [name] 82y.o. male with a history of   PLAN:   1. Prostate Cancer, diagnosed in 2004 --He is 13 years out of his surgery. Clinically doing very well. PSA remains to be <0.1. There is no evidence of recurrence at this point. -Continue monitoring.  2. Carcinoid tumor, diagnosed in 2003  -He is clinically doing well, asymptomatic.  --At this point, there does not appear to be any evidence for recurrence of his carcinoid tumor. -He is 14 years out for the carcinoid tumor, although late recurrence can happen, his risk is pretty small now  -Continue monitoring clinically  3. Anemia of chronic disease (CKD and Crohn's disease) -He has a mild anemia, normal iron and TIBC in the past, elevated ferritin. This is likely anemia of chronic disease secondary to CKD and Crohn's disease. -His hemoglobin is 12.1 today, overall stable. Continue monitoring.  4. Questionable secondary hemochromatosis.  -- He has elevated ferritin  level, likely secondary to his Crohn's disease.  -We checked hemochromatosis gene mutation, which showed H63D mutation heterozygous. This is unlikely hemochromatosis.  -We'll continue monitoring, his ferritin level is slightly trending down   5. Crohn's disease --Management per GI   Follow-up. -Lab CBC and CMP results reviewed with him today, PSA still pending, we will mail the results to him  -- Pt refers to be followed every 6 months, instead of once a year. We will plan to have Mr. Iyengar return in 6 months with repeated CBC, chemistries, iron studies, and a PSA  All questions were answered. The patient knows to call the clinic with any problems, questions or concerns. We can certainly see the patient much sooner if necessary.  I spent 10 minutes counseling the patient face to face. The total  time spent in the appointment was 15 minutes.    Truitt Merle, MD 02/09/2016  2:10 PM

## 2016-02-10 ENCOUNTER — Telehealth: Payer: Self-pay | Admitting: *Deleted

## 2016-02-10 ENCOUNTER — Telehealth: Payer: Self-pay | Admitting: Hematology

## 2016-02-10 LAB — PSA, TOTAL AND FREE

## 2016-02-10 NOTE — Telephone Encounter (Signed)
Spoke with pt and informed pt that nurse will mail lab results done 02/09/16 to pt's home.  When follow up appt is made - for 6 months follow up -  Nurse will mail appt calendar to pt.  Pt stated he would prefer to wait and have results along with appt calendar to be mailed when ready.

## 2016-02-10 NOTE — Telephone Encounter (Signed)
Apt sched & mailed per pof

## 2016-03-28 ENCOUNTER — Other Ambulatory Visit: Payer: Self-pay

## 2016-03-28 MED ORDER — MESALAMINE ER 0.375 G PO CP24: ORAL_CAPSULE | ORAL | 1 refills | Status: DC

## 2016-07-05 ENCOUNTER — Telehealth: Payer: Self-pay | Admitting: *Deleted

## 2016-07-05 NOTE — Telephone Encounter (Signed)
Mailed lab results along with appt calendar for Jan 2018 to pt's home today.

## 2016-08-13 ENCOUNTER — Ambulatory Visit (HOSPITAL_BASED_OUTPATIENT_CLINIC_OR_DEPARTMENT_OTHER): Payer: Medicare Other | Admitting: Hematology

## 2016-08-13 ENCOUNTER — Other Ambulatory Visit (HOSPITAL_BASED_OUTPATIENT_CLINIC_OR_DEPARTMENT_OTHER): Payer: Medicare Other

## 2016-08-13 ENCOUNTER — Encounter: Payer: Self-pay | Admitting: Hematology

## 2016-08-13 VITALS — BP 131/73 | HR 67 | Temp 99.0°F | Resp 17 | Ht 71.25 in | Wt 182.7 lb

## 2016-08-13 DIAGNOSIS — Z8546 Personal history of malignant neoplasm of prostate: Secondary | ICD-10-CM

## 2016-08-13 DIAGNOSIS — D638 Anemia in other chronic diseases classified elsewhere: Secondary | ICD-10-CM

## 2016-08-13 DIAGNOSIS — K5090 Crohn's disease, unspecified, without complications: Secondary | ICD-10-CM

## 2016-08-13 DIAGNOSIS — Z85060 Personal history of malignant carcinoid tumor of small intestine: Secondary | ICD-10-CM | POA: Diagnosis present

## 2016-08-13 DIAGNOSIS — C7A012 Malignant carcinoid tumor of the ileum: Secondary | ICD-10-CM

## 2016-08-13 DIAGNOSIS — N189 Chronic kidney disease, unspecified: Secondary | ICD-10-CM

## 2016-08-13 DIAGNOSIS — R7989 Other specified abnormal findings of blood chemistry: Secondary | ICD-10-CM

## 2016-08-13 DIAGNOSIS — D631 Anemia in chronic kidney disease: Secondary | ICD-10-CM

## 2016-08-13 LAB — CBC WITH DIFFERENTIAL/PLATELET
BASO%: 0.9 % (ref 0.0–2.0)
BASOS ABS: 0 10*3/uL (ref 0.0–0.1)
EOS ABS: 0.2 10*3/uL (ref 0.0–0.5)
EOS%: 3.2 % (ref 0.0–7.0)
HEMATOCRIT: 34.2 % — AB (ref 38.4–49.9)
HGB: 11.7 g/dL — ABNORMAL LOW (ref 13.0–17.1)
LYMPH#: 1.3 10*3/uL (ref 0.9–3.3)
LYMPH%: 24.4 % (ref 14.0–49.0)
MCH: 34 pg — AB (ref 27.2–33.4)
MCHC: 34.3 g/dL (ref 32.0–36.0)
MCV: 99.1 fL — ABNORMAL HIGH (ref 79.3–98.0)
MONO#: 0.3 10*3/uL (ref 0.1–0.9)
MONO%: 5.3 % (ref 0.0–14.0)
NEUT#: 3.5 10*3/uL (ref 1.5–6.5)
NEUT%: 66.2 % (ref 39.0–75.0)
PLATELETS: 200 10*3/uL (ref 140–400)
RBC: 3.45 10*6/uL — ABNORMAL LOW (ref 4.20–5.82)
RDW: 13.5 % (ref 11.0–14.6)
WBC: 5.3 10*3/uL (ref 4.0–10.3)

## 2016-08-13 LAB — COMPREHENSIVE METABOLIC PANEL
ALBUMIN: 4.1 g/dL (ref 3.5–5.0)
ALK PHOS: 44 U/L (ref 40–150)
ALT: 18 U/L (ref 0–55)
AST: 17 U/L (ref 5–34)
Anion Gap: 12 mEq/L — ABNORMAL HIGH (ref 3–11)
BUN: 14.7 mg/dL (ref 7.0–26.0)
CO2: 24 mEq/L (ref 22–29)
Calcium: 9.8 mg/dL (ref 8.4–10.4)
Chloride: 106 mEq/L (ref 98–109)
Creatinine: 1.3 mg/dL (ref 0.7–1.3)
EGFR: 50 mL/min/{1.73_m2} — ABNORMAL LOW (ref >90)
Glucose: 101 mg/dl (ref 70–140)
POTASSIUM: 4.1 meq/L (ref 3.5–5.1)
Sodium: 142 mEq/L (ref 136–145)
Total Bilirubin: 0.84 mg/dL (ref 0.20–1.20)
Total Protein: 6.9 g/dL (ref 6.4–8.3)

## 2016-08-13 LAB — IRON AND TIBC
%SAT: 30 % (ref 20–55)
Iron: 69 ug/dL (ref 42–163)
TIBC: 233 ug/dL (ref 202–409)
UIBC: 164 ug/dL (ref 117–376)

## 2016-08-13 LAB — FERRITIN

## 2016-08-13 NOTE — Progress Notes (Signed)
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   PROBLEM LIST:  1. 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 The patient underwent small bowel resection on 05/19/2002.  2. Adenocarcinoma of the prostate, status post surgery on 06/30/2003.  3. Renal insufficiency.  4. Anemia of chronic disease  5. History of Crohn's disease dating back to 10/2001.  6. COPD.  7. History of kidney stones.  8. Hypertension.  9. Dyslipidemia.  10. Gout diagnosed 10/2011. Symptoms affect the left foot, being treated with Medrol.  11. History of alcohol use.  12. Elevated ferritin 1960 on 11/12/2011. Iron saturation was 41%.  INTERVAL HISTORY: Ronald [name] 83 y.o. 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  He was last seen by me 6 months ago.   He is doing well overall. He had a stroke in 05/2016 and was admitted to [facility], followed by neurologist. He has recovered very well, no residue neurological issues. He has quit alcohol since then. He lives independently, no complains of pain or other symptoms. His BP has been well controlled lately, his meds have been adjusted and he f/u with PCP.   MEDICAL HISTORY: Past Medical History:  Diagnosis Date  . Crohn's   . Esophageal stricture   . GERD (gastroesophageal reflux disease)   . History of alcohol abuse   . Kidney stones   . 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    ALLERGIES:  is allergic to tape.  MEDICATIONS: has a current medication list which includes the following prescription(s): acetaminophen, allopurinol, aspirin, atorvastatin, esomeprazole, labetalol, lisinopril, mesalamine, montelukast, multiple vitamins-minerals, and triamterene-hydrochlorothiazide.  SURGICAL HISTORY:  Past Surgical History:  Procedure Laterality Date  . APPENDECTOMY    . ESOPHAGOGASTRODUODENOSCOPY N/A 08/22/2013   Procedure: ESOPHAGOGASTRODUODENOSCOPY (EGD);  Surgeon: Jerene Bears, MD;  Location: [facility];  Service: Endoscopy;  Laterality: N/A;  . LITHOTRIPSY    . PROSTATECTOMY    . SMALL INTESTINE SURGERY  05/19/02  . TONSILLECTOMY      REVIEW OF SYSTEMS:   Constitutional: Denies fevers, chills or abnormal weight loss Eyes: Denies blurriness of vision Ears, nose, mouth, throat, and face: Denies mucositis or sore throat Respiratory: Denies cough, dyspnea or wheezes Cardiovascular: Denies palpitation, chest discomfort or lower extremity swelling Gastrointestinal:  Denies nausea, heartburn or change in bowel habits Skin: Denies abnormal skin rashes Lymphatics: Denies new lymphadenopathy or easy bruising Neurological:Denies numbness, tingling or new weaknesses Behavioral/Psych: Mood is stable, no new changes  All other systems were reviewed with the patient and are negative.  PHYSICAL EXAMINATION: ECOG PERFORMANCE STATUS: 1 - Symptomatic but completely ambulatory  Blood pressure 131/73, pulse 67, temperature 99 F (37.2 C), temperature source Oral, resp. rate 17, height 5' 11.25" (1.81 m), weight 182 lb 11.2 oz (82.9 kg), SpO2 100 %.  GENERAL:alert,  no distress and comfortable SKIN: skin color, texture, turgor are normal, no rashes or significant lesions EYES: normal, Conjunctiva are  pink and non-injected, sclera clear OROPHARYNX:no exudate, no erythema and lips, buccal mucosa, and tongue normal  NECK: supple, thyroid normal size, non-tender, without nodularity LYMPH:  no palpable lymphadenopathy in the cervical, axillary or supraclavicular LUNGS: clear to auscultation and percussion with normal breathing effort HEART: regular rate & rhythm and no murmurs and no lower extremity edema ABDOMEN:abdomen soft, non-tender and normal bowel sounds Musculoskeletal:no cyanosis of digits and no clubbing  NEURO: alert & oriented x 3 with fluent speech, no focal motor/sensory deficits   LABORATORY DATA: CBC Latest Ref Rng & Units 08/13/2016 02/09/2016 08/11/2015  WBC 4.0 - 10.3 10e3/uL 5.3 4.8 5.6  Hemoglobin 13.0 - 17.1 g/dL 11.7(L) 12.1(L) 12.0(L)  Hematocrit 38.4 - 49.9 % 34.2(L) 34.8(L) 34.0(L)  Platelets 140 - 400 10e3/uL 200 164 179    CMP Latest Ref Rng & Units 08/13/2016 02/09/2016 08/11/2015  Glucose 70 - 140 mg/dl 101 77 78  BUN 7.0 - 26.0 mg/dL 14.7 13.1 11.7  Creatinine 0.7 - 1.3 mg/dL 1.3 1.2 1.3  Sodium 136 - 145 mEq/L 142 139 142  Potassium 3.5 - 5.1 mEq/L 4.1 4.0 4.0  Chloride 98 - 107 mEq/L - - -  CO2 22 - 29 mEq/L 24 21(L) 23  Calcium 8.4 - 10.4 mg/dL 9.8 8.8 8.9  Total Protein 6.4 - 8.3 g/dL 6.9 6.5 6.7  Total Bilirubin 0.20 - 1.20 mg/dL 0.84 0.51 0.47  Alkaline Phos 40 - 150 U/L 44 48 53  AST 5 - 34 U/L 17 29 23   ALT 0 - 55 U/L 18 23 21    Results for AMAD, MAU (MRN [id number]) as of 08/16/2016 21:07  Ref. Range 08/13/2016 12:31  Iron Latest Ref Range: 42 - 163 ug/dL 69  UIBC Latest Ref Range: 117 - 376 ug/dL 164  TIBC Latest Ref Range: 202 - 409 ug/dL 233  %SAT Latest Ref Range: 20 - 55 % 30  Ferritin Latest Ref Range: 22 - 316 ng/ml 1,588 (H)   Results for JAHMIER, WILLADSEN (MRN [id number]) as of 08/16/2016 21:07  Ref. Range 02/09/2016 12:45 08/13/2016 12:31  PSA Latest Ref Range: 0.0 - 4.0 ng/mL <0.1 <0.1  PSA, FREE Latest Ref Range: N/A ng/mL <0.01    % FREE PSA Latest Units: % Test Not Performed.    RADIOGRAPHIC STUDIES: No results found.  ASSESSMENT: Ronald [name] 83 y.o. male with a history of   PLAN:   1. Prostate Cancer, diagnosed in 2004 --He is 14 years out of his surgery. Clinically doing very well. PSA remains to be <0.1. There is no evidence of recurrence at this point. -Continue monitoring.  2. Carcinoid tumor, diagnosed in 2003  -He is clinically doing well, asymptomatic. No concern for recurrence  -He is 15 years out for the carcinoid tumor, although late recurrence can happen, his risk is pretty small now  -Continue monitoring clinically  3. Anemia of chronic disease (CKD and Crohn's disease) -He has a mild anemia, normal iron and TIBC in the past, elevated ferritin. This is likely anemia of chronic disease secondary to CKD and Crohn's disease. -His hemoglobin is 11.7  today, overall stable. Continue monitoring.  4. Questionable secondary hemochromatosis.  -- He has elevated ferritin level, likely secondary to his Crohn's disease.  -We checked hemochromatosis gene mutation, which showed H63D mutation heterozygous. This is unlikely hemochromatosis.  -We'll continue monitoring, his ferritin level has  remained high   5. Crohn's disease --Management per GI   6. Recent stroke -He has recovered well  Follow-up. -Lab CBC and CMP results reviewed with him today, we will mail the results to him  -- Pt refers to be followed every 6 months, instead of once a year. We will plan to have Mr. Krall return in 6 months with repeated CBC, chemistries, iron studies, and a PSA  All questions were answered. The patient knows to call the clinic with any problems, questions or concerns. We can certainly see the patient much sooner if necessary.  I spent 10 minutes counseling the patient face to face. The total time spent in the appointment was 15 minutes.    Truitt Merle, MD 08/13/2016

## 2016-08-14 LAB — PSA: Prostate Specific Ag, Serum: <0.1 ng/mL (ref 0.0–4.0)

## 2016-08-18 ENCOUNTER — Telehealth: Payer: Self-pay | Admitting: Hematology

## 2016-08-18 NOTE — Telephone Encounter (Signed)
vm advising appt 7/30 @ 1.30pm. Also, mailed calendar.

## 2016-09-04 ENCOUNTER — Telehealth: Payer: Self-pay | Admitting: *Deleted

## 2016-09-04 NOTE — Progress Notes (Signed)
Done

## 2016-09-04 NOTE — Telephone Encounter (Signed)
Telephone call to patient to advise lab results. Copy mailed to patient.

## 2016-09-07 ENCOUNTER — Other Ambulatory Visit: Payer: Self-pay | Admitting: Gastroenterology

## 2016-09-07 ENCOUNTER — Telehealth: Payer: Self-pay

## 2016-09-07 NOTE — Telephone Encounter (Signed)
Refill request on Nexium 40 mg one a day. Also needs refills on Apriso 0.375 gm caps 4 po daily  Every morning. Last seen 08-2015. Please advise

## 2016-09-08 NOTE — Telephone Encounter (Signed)
Yes, both can be refilled for 2 months.  He is due to see me in the office, please set it up

## 2016-09-10 NOTE — Telephone Encounter (Signed)
RX refilled and follow up office visit made.

## 2016-10-22 ENCOUNTER — Ambulatory Visit: Payer: Medicare Other | Admitting: Gastroenterology

## 2016-12-11 ENCOUNTER — Other Ambulatory Visit: Payer: Self-pay | Admitting: Gastroenterology

## 2016-12-11 NOTE — Telephone Encounter (Signed)
I refilled both for a month each.  I have not seen this patient for well over a year.   Needs to come to June visit

## 2016-12-11 NOTE — Telephone Encounter (Signed)
Pt has a follow up on 12-26-2016. Please advise on refills.

## 2016-12-26 ENCOUNTER — Encounter: Payer: Self-pay | Admitting: Gastroenterology

## 2016-12-26 ENCOUNTER — Ambulatory Visit (INDEPENDENT_AMBULATORY_CARE_PROVIDER_SITE_OTHER): Payer: Medicare Other | Admitting: Gastroenterology

## 2016-12-26 VITALS — BP 180/90 | HR 72 | Ht 71.25 in | Wt 177.1 lb

## 2016-12-26 DIAGNOSIS — K5000 Crohn's disease of small intestine without complications: Secondary | ICD-10-CM | POA: Diagnosis not present

## 2016-12-26 DIAGNOSIS — K222 Esophageal obstruction: Secondary | ICD-10-CM | POA: Diagnosis not present

## 2016-12-26 DIAGNOSIS — K219 Gastro-esophageal reflux disease without esophagitis: Secondary | ICD-10-CM | POA: Diagnosis not present

## 2016-12-26 MED ORDER — MESALAMINE ER 0.375 G PO CP24: 1500.0000 mg | ORAL_CAPSULE | Freq: Every day | ORAL | 3 refills | Status: DC

## 2016-12-26 MED ORDER — ESOMEPRAZOLE MAGNESIUM 40 MG PO CPDR: 40.0000 mg | DELAYED_RELEASE_CAPSULE | Freq: Every day | ORAL | 3 refills | Status: DC

## 2016-12-26 NOTE — Progress Notes (Signed)
     Ronald [name]  Chief Complaint: Small bowel Crohn's disease and esophageal stricture  Subjective  History:  This is an 83 year old man last seen in February 2017. Briefly, he was diagnosed about 15 years ago with small bowel and proximal colonic Crohn's disease based on a colonoscopy by Dr. [name] in 2003. He has been on low-dose mesalamine since then with good symptom control. He had no evidence of recurrence on a colonoscopy in 2009. He has been reluctant to stop the medication since he has felt so well for so many years. His last esophageal dilation was in March 2015. Since then, he has no solid food dysphagia. He denies odynophagia, early satiety, vomiting or weight loss. His appetite is variable, but he attributes that to aging. [place]z denies abdominal pain, diarrhea or rectal bleeding.  ROS: Cardiovascular:  no chest pain Respiratory: no dyspnea  The patient's Past Medical, Family and Social History were reviewed and are on file in the EMR.  Objective:  Med list reviewed  Vital signs in last 24 hrs: Vitals:   12/26/16 1105  BP: (!) 180/90  Pulse: 72    Physical Exam    HEENT: sclera anicteric, oral mucosa moist without lesions  Neck: supple, no thyromegaly, JVD or lymphadenopathy  Cardiac: RRR without murmurs, S1S2 heard, no peripheral edema  Pulm: clear to auscultation bilaterally, normal RR and effort noted  Abdomen: soft, No tenderness, with active bowel sounds. No guarding or palpable hepatosplenomegaly.  Skin; warm and dry, no jaundice or rash  Recent Labs:  CMP Latest Ref Rng & Units 08/13/2016 02/09/2016 08/11/2015  Glucose 70 - 140 mg/dl 101 77 78  BUN 7.0 - 26.0 mg/dL 14.7 13.1 11.7  Creatinine 0.7 - 1.3 mg/dL 1.3 1.2 1.3  Sodium 136 - 145 mEq/L 142 139 142  Potassium 3.5 - 5.1 mEq/L 4.1 4.0 4.0  Chloride 98 - 107 mEq/L - - -  CO2 22 - 29 mEq/L 24 21(L) 23  Calcium 8.4 - 10.4 mg/dL 9.8 8.8 8.9  Total Protein 6.4 - 8.3 g/dL 6.9  6.5 6.7  Total Bilirubin 0.20 - 1.20 mg/dL 0.84 0.51 0.47  Alkaline Phos 40 - 150 U/L 44 48 53  AST 5 - 34 U/L 17 29 23   ALT 0 - 55 U/L 18 23 21    CBC Latest Ref Rng & Units 08/13/2016 02/09/2016 08/11/2015  WBC 4.0 - 10.3 10e3/uL 5.3 4.8 5.6  Hemoglobin 13.0 - 17.1 g/dL 11.7(L) 12.1(L) 12.0(L)  Hematocrit 38.4 - 49.9 % 34.2(L) 34.8(L) 34.0(L)  Platelets 140 - 400 10e3/uL 200 164 179    @ASSESSMENTPLANBEGIN @ Assessment: Encounter Diagnoses  Name Primary?  . Crohn's disease of small intestine without complication ([place]) Yes  . Stricture and stenosis of esophagus   . Gastroesophageal reflux disease, esophagitis presence not specified     He is doing well with stable symptoms. He does not wish to stop his mesalamine out of concern that he will have a symptom flare. I think the likelihood of that is low, but he feels very strongly about it. I think it is a low risk medicine to continue long-term, he feels well on it, so we will continue.  Plan:  Mesalamine, 1500 mg once daily Continue Nexium, but I suggested he could decrease it to every other day. See me in a year or sooner as needed.  Total time 15 minutes, over half spent in counseling and coordination of care.   Nelida Meuse III

## 2016-12-26 NOTE — Patient Instructions (Signed)
If you are age 81 or older, your body mass index should be between 23-30. Your Body mass index is 24.53 kg/m. If this is out of the aforementioned range listed, please consider follow up with your Primary Care Provider.  If you are age 66 or younger, your body mass index should be between 19-25. Your Body mass index is 24.53 kg/m. If this is out of the aformentioned range listed, please consider follow up with your Primary Care Provider.   Follow up in one year.  Thank you for choosing [facility]  Dr Wilfrid Lund III

## 2017-02-11 ENCOUNTER — Encounter: Payer: Self-pay | Admitting: Hematology

## 2017-02-11 ENCOUNTER — Other Ambulatory Visit (HOSPITAL_BASED_OUTPATIENT_CLINIC_OR_DEPARTMENT_OTHER): Payer: Medicare Other

## 2017-02-11 ENCOUNTER — Ambulatory Visit (HOSPITAL_BASED_OUTPATIENT_CLINIC_OR_DEPARTMENT_OTHER): Payer: Medicare Other | Admitting: Hematology

## 2017-02-11 VITALS — BP 133/65 | HR 63 | Temp 98.0°F | Resp 19 | Ht 71.25 in | Wt 182.1 lb

## 2017-02-11 DIAGNOSIS — N189 Chronic kidney disease, unspecified: Secondary | ICD-10-CM | POA: Diagnosis not present

## 2017-02-11 DIAGNOSIS — Z85060 Personal history of malignant carcinoid tumor of small intestine: Secondary | ICD-10-CM

## 2017-02-11 DIAGNOSIS — D631 Anemia in chronic kidney disease: Secondary | ICD-10-CM | POA: Diagnosis not present

## 2017-02-11 DIAGNOSIS — Z8546 Personal history of malignant neoplasm of prostate: Secondary | ICD-10-CM | POA: Diagnosis present

## 2017-02-11 DIAGNOSIS — K5090 Crohn's disease, unspecified, without complications: Secondary | ICD-10-CM | POA: Diagnosis not present

## 2017-02-11 DIAGNOSIS — C7A012 Malignant carcinoid tumor of the ileum: Secondary | ICD-10-CM

## 2017-02-11 LAB — COMPREHENSIVE METABOLIC PANEL
ALBUMIN: 3.9 g/dL (ref 3.5–5.0)
ALK PHOS: 68 U/L (ref 40–150)
ALT: 46 U/L (ref 0–55)
AST: 32 U/L (ref 5–34)
Anion Gap: 8 mEq/L (ref 3–11)
BUN: 16.7 mg/dL (ref 7.0–26.0)
CALCIUM: 9.5 mg/dL (ref 8.4–10.4)
CO2: 25 mEq/L (ref 22–29)
CREATININE: 1.2 mg/dL (ref 0.7–1.3)
Chloride: 108 mEq/L (ref 98–109)
EGFR: 55 mL/min/{1.73_m2} — ABNORMAL LOW (ref >90)
GLUCOSE: 89 mg/dL (ref 70–140)
Potassium: 4.3 mEq/L (ref 3.5–5.1)
SODIUM: 140 meq/L (ref 136–145)
TOTAL PROTEIN: 7 g/dL (ref 6.4–8.3)
Total Bilirubin: 0.5 mg/dL (ref 0.20–1.20)

## 2017-02-11 LAB — CBC WITH DIFFERENTIAL/PLATELET
BASO%: 1.2 % (ref 0.0–2.0)
BASOS ABS: 0.1 10*3/uL (ref 0.0–0.1)
EOS%: 1.2 % (ref 0.0–7.0)
Eosinophils Absolute: 0.1 10*3/uL (ref 0.0–0.5)
HEMATOCRIT: 34.7 % — AB (ref 38.4–49.9)
HEMOGLOBIN: 12 g/dL — AB (ref 13.0–17.1)
LYMPH#: 1.2 10*3/uL (ref 0.9–3.3)
LYMPH%: 22.2 % (ref 14.0–49.0)
MCH: 33.7 pg — ABNORMAL HIGH (ref 27.2–33.4)
MCHC: 34.6 g/dL (ref 32.0–36.0)
MCV: 97.5 fL (ref 79.3–98.0)
MONO#: 0.3 10*3/uL (ref 0.1–0.9)
MONO%: 5.6 % (ref 0.0–14.0)
NEUT%: 69.8 % (ref 39.0–75.0)
NEUTROS ABS: 3.9 10*3/uL (ref 1.5–6.5)
Platelets: 198 10*3/uL (ref 140–400)
RBC: 3.56 10*6/uL — ABNORMAL LOW (ref 4.20–5.82)
RDW: 13.3 % (ref 11.0–14.6)
WBC: 5.6 10*3/uL (ref 4.0–10.3)

## 2017-02-11 NOTE — Progress Notes (Signed)
[place] OFFICE PROGRESS NOTE  DIAGNOSIS: History of PROSTATE CANCER, HX OF  Malignant carcinoid tumor of ileum ([place])  Chief complain: follow up prostate cancer, carcinoid tumor of ileum and anemia   PROBLEM LIST:  1. 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 The patient underwent small bowel resection on 05/19/2002.  2. Adenocarcinoma of the prostate, status post surgery on 06/30/2003.  3. Renal insufficiency.  4. Anemia of chronic disease  5. History of Crohn's disease dating back to 10/2001.  6. COPD.  7. History of kidney stones.  8. Hypertension.  9. Dyslipidemia.  10. Gout diagnosed 10/2011. Symptoms affect the left foot, being treated with Medrol.  11. History of alcohol use.  12. Elevated ferritin 1960 on 11/12/2011. Iron saturation was 41%.  CURRENT THERAPY: Surveillance  INTERVAL HISTORY:  Ronald [name] 83 y.o. 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  He was last seen by me 6 months ago.   He presents to the clinic today reporting to be doing well. He has good AC working for his mobile home. He has no new pain. His BP was higher at home before coming to clinic. He took medication before arriving. He is no longer taking the fluid pill. He has yet to feel dizzy with this medication.  He has his home grown green beans and tomatoes. He has neck stiffness that he will follow up with another physician for. He has recently gained some weight. His BM are regular.    MEDICAL HISTORY: Past Medical History:  Diagnosis Date  . Crohn's   . Esophageal stricture   . GERD (gastroesophageal reflux disease)   . History of alcohol abuse   . Kidney stones   . Malignant carcinoid tumor of ileum ([place])   . Prostate cancer ([place])     INTERIM HISTORY: has Malignant carcinoid tumor of ileum ([place]); ESOPHAGEAL STRICTURE;  GERD; CROHN'S DISEASE; PROSTATE CANCER, HX OF; ALCOHOL ABUSE, HX OF; SMALL BOWEL OBSTRUCTION, HX OF; Abnormal liver function tests; Deficiency anemia; Elevated ferritin; Food impaction of esophagus; and Anemia of chronic disease on his problem list.    ALLERGIES:  is allergic to tape.  MEDICATIONS: has a current medication list which includes the following prescription(s): acetaminophen, allopurinol, aspirin, atorvastatin, esomeprazole, labetalol, lisinopril, mesalamine, montelukast, multiple vitamins-minerals, and triamterene-hydrochlorothiazide.  SURGICAL HISTORY:  Past Surgical History:  Procedure Laterality Date  . APPENDECTOMY    . ESOPHAGOGASTRODUODENOSCOPY N/A 08/22/2013   Procedure: ESOPHAGOGASTRODUODENOSCOPY (EGD);  Surgeon: Jerene Bears, MD;  Location: [facility];  Service: Endoscopy;  Laterality: N/A;  . LITHOTRIPSY    . PROSTATECTOMY    . SMALL INTESTINE SURGERY  05/19/02  . TONSILLECTOMY      REVIEW OF SYSTEMS:   Constitutional: Denies fevers, chills or abnormal weight loss (+) some weight gain  Eyes: Denies blurriness of vision Ears, nose, mouth, throat, and face: Denies mucositis or sore throat Respiratory: Denies cough, dyspnea or wheezes Cardiovascular: Denies palpitation, chest discomfort or lower extremity swelling Gastrointestinal:  Denies nausea, heartburn or change in bowel habits Skin: Denies abnormal skin rashes Lymphatics: Denies new lymphadenopathy or easy bruising Neurological:Denies numbness, tingling or new weaknesses MSK: (+) neck stiffness Behavioral/Psych: Mood is stable, no new changes  All other systems were reviewed with the patient and are negative.  PHYSICAL EXAMINATION: ECOG PERFORMANCE STATUS: 1 - Symptomatic but completely ambulatory  Blood pressure  133/65, pulse 63, temperature 98 F (36.7 C), temperature source Oral, resp. rate 19, height 5' 11.25" (1.81 m), weight 182 lb 1.6 oz (82.6 kg), SpO2 100 %.  GENERAL:alert, no distress and  comfortable SKIN: skin color, texture, turgor are normal, no rashes or significant lesions EYES: normal, Conjunctiva are pink and non-injected, sclera clear OROPHARYNX:no exudate, no erythema and lips, buccal mucosa, and tongue normal  NECK: supple, thyroid normal size, non-tender, without nodularity LYMPH:  no palpable lymphadenopathy in the cervical, axillary or supraclavicular LUNGS: clear to auscultation and percussion with normal breathing effort HEART: regular rate & rhythm and no murmurs and no lower extremity edema ABDOMEN:abdomen soft, non-tender and normal bowel sounds Musculoskeletal:no cyanosis of digits and no clubbing  NEURO: alert & oriented x 3 with fluent speech, no focal motor/sensory deficits   LABORATORY DATA: CBC Latest Ref Rng & Units 02/11/2017 08/13/2016 02/09/2016  WBC 4.0 - 10.3 10e3/uL 5.6 5.3 4.8  Hemoglobin 13.0 - 17.1 g/dL 12.0(L) 11.7(L) 12.1(L)  Hematocrit 38.4 - 49.9 % 34.7(L) 34.2(L) 34.8(L)  Platelets 140 - 400 10e3/uL 198 200 164    CMP Latest Ref Rng & Units 02/11/2017 08/13/2016 02/09/2016  Glucose 70 - 140 mg/dl 89 101 77  BUN 7.0 - 26.0 mg/dL 16.7 14.7 13.1  Creatinine 0.7 - 1.3 mg/dL 1.2 1.3 1.2  Sodium 136 - 145 mEq/L 140 142 139  Potassium 3.5 - 5.1 mEq/L 4.3 4.1 4.0  Chloride 98 - 107 mEq/L - - -  CO2 22 - 29 mEq/L 25 24 21(L)  Calcium 8.4 - 10.4 mg/dL 9.5 9.8 8.8  Total Protein 6.4 - 8.3 g/dL 7.0 6.9 6.5  Total Bilirubin 0.20 - 1.20 mg/dL 0.50 0.84 0.51  Alkaline Phos 40 - 150 U/L 68 44 48  AST 5 - 34 U/L 32 17 29  ALT 0 - 55 U/L 46 18 23    Results for JOHNATHA, ZEIDMAN (MRN [id number]) as of 08/16/2016 21:07  Ref. Range 08/13/2016 12:31  Iron Latest Ref Range: 42 - 163 ug/dL 69  UIBC Latest Ref Range: 117 - 376 ug/dL 164  TIBC Latest Ref Range: 202 - 409 ug/dL 233  %SAT Latest Ref Range: 20 - 55 % 30  Ferritin Latest Ref Range: 22 - 316 ng/ml 1,588 (H)   Results for MILAN, PERKINS (MRN [id number]) as of 02/11/2017 13:20  Ref. Range  08/11/2015 12:27 02/09/2016 12:45 08/13/2016 12:31  Prostate Specific Ag, Serum Latest Ref Range: 0.0 - 4.0 ng/mL <0.1 <0.1 <0.1  PSA, FREE Latest Ref Range: N/A ng/mL <0.01 <0.01   % FREE PSA Latest Units: % Test Not Performed. Test Not Performed.   PENDING for 02/11/17    RADIOGRAPHIC STUDIES: No results found.  ASSESSMENT: Arlis L Krenn 83 y.o. male with a history of   PLAN:   1. Prostate Cancer, diagnosed in 2004 --He is 14 years out of his surgery. Clinically doing very well. PSA remains to be <0.1. There is no evidence of recurrence at this point. -Continue monitoring. -He is clinically doing well. His Cbc and CMP overall normal. His slight anemia improved. His exam was normal. No evidence of recurrence.  -Will f/u in 6 months.    2. Carcinoid tumor, diagnosed in 2003  -He is clinically doing well, asymptomatic. No concern for recurrence  -He is 15 years out for the carcinoid tumor, although late recurrence can happen, his risk is pretty small now  -Continue monitoring clinically  3. Anemia of chronic disease (CKD and Crohn's disease) -  He has a mild anemia, normal iron and TIBC in the past, elevated ferritin. This is likely anemia of chronic disease secondary to CKD and Crohn's disease. -His hemoglobin is 12.0 today (02/11/17), overall stable. Continue monitoring.  4. Questionable secondary hemochromatosis.  -- He has elevated ferritin level, likely secondary to his Crohn's disease.  -We checked hemochromatosis gene mutation, which showed H63D mutation heterozygous. This is unlikely hemochromatosis.  -We'll continue monitoring  5. Crohn's disease --Management per GI   6. Recent stroke -He has recovered well  Follow-up. -Pt refers to be followed every 6 months, instead of once a year. We will plan to have Mr. Sorci return in 6 months with repeated CBC, chemistries, iron studies, and a PSA   All questions were answered. The patient knows to call the clinic with any  problems, questions or concerns. We can certainly see the patient much sooner if necessary.  I spent 10 minutes counseling the patient face to face. The total time spent in the appointment was 15 minutes.  This document serves as a record of services personally performed by Truitt Merle, MD. It was created on her behalf by Joslyn Devon, a trained medical scribe. The creation of this record is based on the scribe's personal observations and the provider's statements to them. This document has been checked and approved by the attending provider.    Truitt Merle, MD 02/11/2017

## 2017-02-12 ENCOUNTER — Telehealth: Payer: Self-pay | Admitting: Hematology

## 2017-02-12 LAB — PSA: Prostate Specific Ag, Serum: 0.2 ng/mL (ref 0.0–4.0)

## 2017-02-12 NOTE — Telephone Encounter (Signed)
Spoke with patient about his upcoming appointments in January.   Transferred him to the nurse because he had additional questions about his lab results.

## 2017-02-15 ENCOUNTER — Telehealth: Payer: Self-pay | Admitting: *Deleted

## 2017-02-15 NOTE — Telephone Encounter (Signed)
Informed of good lab results per Dr Ernestina Penna message.

## 2017-02-15 NOTE — Telephone Encounter (Signed)
-----   Message from Truitt Merle, MD sent at 02/13/2017  7:22 AM EDT ----- Please let pt know his lab results, including PSA which was normal. No concerns. Thanks  Truitt Merle  02/13/2017

## 2017-05-24 ENCOUNTER — Telehealth: Payer: Self-pay | Admitting: Hematology

## 2017-05-24 NOTE — Telephone Encounter (Signed)
Mailed calendar to patient per patient request.

## 2017-08-08 ENCOUNTER — Telehealth: Payer: Self-pay | Admitting: Hematology

## 2017-08-08 NOTE — Telephone Encounter (Signed)
Rescheduled appts per patients voicemail - wanted appts moved to April due to possible weather

## 2017-08-15 ENCOUNTER — Ambulatory Visit: Payer: Medicare Other | Admitting: Hematology

## 2017-08-15 ENCOUNTER — Other Ambulatory Visit: Payer: Medicare Other

## 2017-10-14 ENCOUNTER — Other Ambulatory Visit: Payer: Self-pay | Admitting: Gastroenterology

## 2017-10-15 NOTE — Telephone Encounter (Signed)
I refilled the medicine at same dose.  Please contact him for an appointment to see me in next 2 months.

## 2017-10-15 NOTE — Telephone Encounter (Signed)
Refill request for Apriso 0.318m take 4 po daily. Last seen 12-2016. Follow up 1 year due in 12-2017.

## 2017-10-16 NOTE — Telephone Encounter (Signed)
Left a message to return call.  

## 2017-10-16 NOTE — Telephone Encounter (Signed)
Follow up scheduled for 12-02-2017.
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
## 2017-10-28 ENCOUNTER — Inpatient Hospital Stay: Payer: Medicare Other | Attending: Hematology

## 2017-10-28 ENCOUNTER — Telehealth: Payer: Self-pay

## 2017-10-28 ENCOUNTER — Encounter: Payer: Self-pay | Admitting: Hematology

## 2017-10-28 ENCOUNTER — Inpatient Hospital Stay (HOSPITAL_BASED_OUTPATIENT_CLINIC_OR_DEPARTMENT_OTHER): Payer: Medicare Other | Admitting: Hematology

## 2017-10-28 VITALS — BP 171/75 | HR 78 | Temp 97.6°F | Resp 20 | Ht 71.25 in | Wt 185.1 lb

## 2017-10-28 DIAGNOSIS — K5090 Crohn's disease, unspecified, without complications: Secondary | ICD-10-CM | POA: Insufficient documentation

## 2017-10-28 DIAGNOSIS — Z8546 Personal history of malignant neoplasm of prostate: Secondary | ICD-10-CM

## 2017-10-28 DIAGNOSIS — Z8673 Personal history of transient ischemic attack (TIA), and cerebral infarction without residual deficits: Secondary | ICD-10-CM | POA: Diagnosis not present

## 2017-10-28 DIAGNOSIS — C61 Malignant neoplasm of prostate: Secondary | ICD-10-CM | POA: Insufficient documentation

## 2017-10-28 DIAGNOSIS — I129 Hypertensive chronic kidney disease with stage 1 through stage 4 chronic kidney disease, or unspecified chronic kidney disease: Secondary | ICD-10-CM

## 2017-10-28 DIAGNOSIS — N189 Chronic kidney disease, unspecified: Secondary | ICD-10-CM

## 2017-10-28 DIAGNOSIS — Z85060 Personal history of malignant carcinoid tumor of small intestine: Secondary | ICD-10-CM | POA: Diagnosis present

## 2017-10-28 DIAGNOSIS — D631 Anemia in chronic kidney disease: Secondary | ICD-10-CM | POA: Insufficient documentation

## 2017-10-28 DIAGNOSIS — C7A012 Malignant carcinoid tumor of the ileum: Secondary | ICD-10-CM

## 2017-10-28 DIAGNOSIS — D638 Anemia in other chronic diseases classified elsewhere: Secondary | ICD-10-CM

## 2017-10-28 LAB — COMPREHENSIVE METABOLIC PANEL
ALBUMIN: 3.9 g/dL (ref 3.5–5.0)
ALT: 12 U/L (ref 0–55)
ANION GAP: 9 (ref 3–11)
AST: 14 U/L (ref 5–34)
Alkaline Phosphatase: 73 U/L (ref 40–150)
BILIRUBIN TOTAL: 0.4 mg/dL (ref 0.2–1.2)
BUN: 12 mg/dL (ref 7–26)
CHLORIDE: 109 mmol/L (ref 98–109)
CO2: 22 mmol/L (ref 22–29)
Calcium: 9.5 mg/dL (ref 8.4–10.4)
Creatinine, Ser: 1.36 mg/dL — ABNORMAL HIGH (ref 0.70–1.30)
GFR calc Af Amer: 55 mL/min — ABNORMAL LOW (ref >60)
GFR, EST NON AFRICAN AMERICAN: 47 mL/min — AB (ref >60)
Glucose, Bld: 102 mg/dL (ref 70–140)
POTASSIUM: 4 mmol/L (ref 3.5–5.1)
Sodium: 140 mmol/L (ref 136–145)
TOTAL PROTEIN: 7.5 g/dL (ref 6.4–8.3)

## 2017-10-28 LAB — CBC WITH DIFFERENTIAL/PLATELET
Basophils Absolute: 0 10*3/uL (ref 0.0–0.1)
Basophils Relative: 0 %
Eosinophils Absolute: 0 10*3/uL (ref 0.0–0.5)
Eosinophils Relative: 1 %
HEMATOCRIT: 37.7 % — AB (ref 38.4–49.9)
HEMOGLOBIN: 13 g/dL (ref 13.0–17.1)
LYMPHS PCT: 16 %
Lymphs Abs: 1 10*3/uL (ref 0.9–3.3)
MCH: 33.1 pg (ref 27.2–33.4)
MCHC: 34.5 g/dL (ref 32.0–36.0)
MCV: 95.9 fL (ref 79.3–98.0)
Monocytes Absolute: 0.3 10*3/uL (ref 0.1–0.9)
Monocytes Relative: 4 %
NEUTROS ABS: 5.2 10*3/uL (ref 1.5–6.5)
Neutrophils Relative %: 79 %
Platelets: 192 10*3/uL (ref 140–400)
RBC: 3.93 MIL/uL — AB (ref 4.20–5.82)
RDW: 13.7 % (ref 11.0–14.6)
WBC: 6.6 10*3/uL (ref 4.0–10.3)

## 2017-10-28 LAB — IRON AND TIBC
IRON: 72 ug/dL (ref 42–163)
Saturation Ratios: 27 % — ABNORMAL LOW (ref 42–163)
TIBC: 263 ug/dL (ref 202–409)
UIBC: 191 ug/dL

## 2017-10-28 LAB — FERRITIN: FERRITIN: 786 ng/mL — AB (ref 22–316)

## 2017-10-28 NOTE — Telephone Encounter (Signed)
Printed avs and calender of upcoming appointment. Per 4/15 los

## 2017-10-29 LAB — PROSTATE-SPECIFIC AG, SERUM (LABCORP): Prostate Specific Ag, Serum: <0.1 ng/mL (ref 0.0–4.0)

## 2017-10-31 ENCOUNTER — Telehealth: Payer: Self-pay | Admitting: *Deleted

## 2017-10-31 NOTE — Telephone Encounter (Signed)
Left message for pt to call back for good lab results.

## 2017-10-31 NOTE — Telephone Encounter (Signed)
-----   Message from Truitt Merle, MD sent at 10/29/2017  5:27 PM EDT ----- Please call pt and let him know the lab results, no concerns. Mail him a copy if he wants. Thanks.  Truitt Merle  10/29/2017

## 2017-10-31 NOTE — Telephone Encounter (Signed)
Pt called back & message given regarding labs per Dr Burr Medico.  Pt req copy of labs.  These were mailed 10/31/17.

## 2017-11-06 NOTE — Telephone Encounter (Signed)
error 

## 2017-12-02 ENCOUNTER — Ambulatory Visit: Payer: Medicare Other | Admitting: Gastroenterology

## 2017-12-02 ENCOUNTER — Ambulatory Visit (INDEPENDENT_AMBULATORY_CARE_PROVIDER_SITE_OTHER): Payer: Medicare Other | Admitting: Gastroenterology

## 2017-12-02 ENCOUNTER — Encounter: Payer: Self-pay | Admitting: Gastroenterology

## 2017-12-02 VITALS — BP 126/70 | HR 72 | Ht 71.25 in | Wt 187.0 lb

## 2017-12-02 DIAGNOSIS — K219 Gastro-esophageal reflux disease without esophagitis: Secondary | ICD-10-CM | POA: Diagnosis not present

## 2017-12-02 MED ORDER — ESOMEPRAZOLE MAGNESIUM 40 MG PO CPDR: 40.0000 mg | DELAYED_RELEASE_CAPSULE | Freq: Every day | ORAL | 3 refills | Status: AC

## 2017-12-02 NOTE — Progress Notes (Signed)
Ronald [name]  Chief Complaint: Crohn's disease and GERD  Subjective  History:  This is follow-up for a very pleasant 84 year old man previously followed by Dr. [name], and then by me over the last 2 years.  He carries a diagnosis of ileal Crohn's disease, but that has been somewhat uncertain.  He denies abdominal pain, diarrhea, change in bowel habits, loss of appetite, vomiting, dysphagia or nausea.  He has remained on mesalamine ever since his initial diagnosis 16 years ago.  He also has intermittent pyrosis for which he takes Nexium almost daily. 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  He recently had a rheumatology consult, and was disappointed when they told him he did not have arthritis.  The patient's Past Medical, Family and Social History were reviewed and are on file in the EMR.  Objective:  Med list reviewed  Current Outpatient Medications:  .  acetaminophen (TYLENOL ARTHRITIS PAIN) 650 MG CR tablet, Take 650 mg by mouth daily as needed.  , Disp: , Rfl:  .  allopurinol (ZYLOPRIM) 300 MG tablet, Take 150 mg by mouth daily., Disp: , Rfl:  .  aspirin 81 MG tablet, Take 81 mg by mouth daily., Disp: , Rfl:  .  atorvastatin (LIPITOR) 80 MG tablet, Take 80 mg by mouth at bedtime., Disp: , Rfl:  .  esomeprazole (NEXIUM) 40 MG capsule, Take 1 capsule (40 mg total) by mouth daily., Disp: 90 capsule, Rfl: 3 .  labetalol (NORMODYNE) 200 MG tablet, Take 200 mg by mouth 3 (three) times daily., Disp: , Rfl:  .  lisinopril (PRINIVIL,ZESTRIL) 20 MG tablet, Take 20 mg by mouth 2 (two) times daily. , Disp: , Rfl:  .  Multiple Vitamins-Minerals (CENTRUM SILVER ULTRA MENS PO), Take 1  tablet by mouth daily., Disp: , Rfl:  .  Omega-3 Fatty Acids (FISH OIL) 1000 MG CPDR, Take by mouth daily., Disp: , Rfl:  .  thiamine 100 MG tablet, Take 100 mg by mouth daily., Disp: , Rfl:  .  triamterene-hydrochlorothiazide (MAXZIDE-25) 37.5-25 MG tablet, , Disp: , Rfl:    Vital signs in last 24 hrs: Vitals:   12/02/17 1329  BP: 126/70  Pulse: 72    Physical Exam  Well-appearing elderly man  HEENT: sclera anicteric, oral mucosa moist without lesions  Neck: supple, no thyromegaly, JVD or lymphadenopathy  Cardiac: RRR without murmurs, S1S2 heard, no peripheral edema  Pulm: clear to auscultation bilaterally, normal RR and effort noted  Abdomen: soft, no tenderness, with active bowel sounds. No guarding or palpable hepatosplenomegaly.  Skin; warm and dry, no jaundice or rash   @ASSESSMENTPLANBEGIN @ Assessment: Encounter Diagnosis  Name Primary?  . Gastroesophageal reflux disease without esophagitis Yes   He does not appear to have ever truly had ileal Crohn's disease.  I stopped his mesalamine and I think he will continue to be well.  He is past age for colon cancer screening.  He has chronic stable reflux symptoms without red flag symptoms.  I will continue Nexium, and I have again encouraged him to try it every second or third day even if there is some breakthrough symptoms.   Plan: He will see me in a year or sooner as needed.  Total time 20 minutes, over half spent face-to-face with patient in counseling and coordination of care.   Ronald [name]

## 2017-12-02 NOTE — Patient Instructions (Signed)
If you are age 82 or older, your body mass index should be between 23-30. Your Body mass index is 25.9 kg/m. If this is out of the aforementioned range listed, please consider follow up with your Primary Care Provider.  If you are age 57 or younger, your body mass index should be between 19-25. Your Body mass index is 25.9 kg/m. If this is out of the aformentioned range listed, please consider follow up with your Primary Care Provider.   Stop the mesalamine as discussed.  We have sent the following medications to your pharmacy for you to pick up at your convenience: Nexium.  It was a pleasure to meet you today!  Dr. Loletha Carrow

## 2018-04-28 ENCOUNTER — Inpatient Hospital Stay: Payer: Medicare Other

## 2018-04-28 ENCOUNTER — Inpatient Hospital Stay: Payer: Medicare Other | Attending: Hematology | Admitting: Hematology

## 2018-04-28 VITALS — BP 161/74 | HR 72 | Temp 98.1°F | Resp 18 | Ht 71.0 in | Wt 185.3 lb

## 2018-04-28 DIAGNOSIS — D631 Anemia in chronic kidney disease: Secondary | ICD-10-CM | POA: Diagnosis not present

## 2018-04-28 DIAGNOSIS — C7A012 Malignant carcinoid tumor of the ileum: Secondary | ICD-10-CM

## 2018-04-28 DIAGNOSIS — K5090 Crohn's disease, unspecified, without complications: Secondary | ICD-10-CM | POA: Diagnosis not present

## 2018-04-28 DIAGNOSIS — Z8546 Personal history of malignant neoplasm of prostate: Secondary | ICD-10-CM

## 2018-04-28 DIAGNOSIS — Z85060 Personal history of malignant carcinoid tumor of small intestine: Secondary | ICD-10-CM

## 2018-04-28 DIAGNOSIS — N189 Chronic kidney disease, unspecified: Secondary | ICD-10-CM | POA: Diagnosis not present

## 2018-04-28 LAB — CBC WITH DIFFERENTIAL/PLATELET
ABS IMMATURE GRANULOCYTES: 0.11 10*3/uL — AB (ref 0.00–0.07)
Basophils Absolute: 0.1 10*3/uL (ref 0.0–0.1)
Basophils Relative: 1 %
EOS ABS: 0.1 10*3/uL (ref 0.0–0.5)
EOS PCT: 2 %
HCT: 34 % — ABNORMAL LOW (ref 39.0–52.0)
Hemoglobin: 11.7 g/dL — ABNORMAL LOW (ref 13.0–17.0)
Immature Granulocytes: 2 %
Lymphocytes Relative: 21 %
Lymphs Abs: 1.5 10*3/uL (ref 0.7–4.0)
MCH: 33.2 pg (ref 26.0–34.0)
MCHC: 34.4 g/dL (ref 30.0–36.0)
MCV: 96.6 fL (ref 80.0–100.0)
MONOS PCT: 4 %
Monocytes Absolute: 0.3 10*3/uL (ref 0.1–1.0)
Neutro Abs: 5 10*3/uL (ref 1.7–7.7)
Neutrophils Relative %: 70 %
PLATELETS: 205 10*3/uL (ref 150–400)
RBC: 3.52 MIL/uL — AB (ref 4.22–5.81)
RDW: 13.2 % (ref 11.5–15.5)
WBC: 7 10*3/uL (ref 4.0–10.5)
nRBC: 0 % (ref 0.0–0.2)

## 2018-04-28 LAB — COMPREHENSIVE METABOLIC PANEL
ALT: 12 U/L (ref 0–44)
ANION GAP: 6 (ref 5–15)
AST: 16 U/L (ref 15–41)
Albumin: 3.7 g/dL (ref 3.5–5.0)
Alkaline Phosphatase: 59 U/L (ref 38–126)
BUN: 15 mg/dL (ref 8–23)
CALCIUM: 9.1 mg/dL (ref 8.9–10.3)
CO2: 26 mmol/L (ref 22–32)
CREATININE: 1.59 mg/dL — AB (ref 0.61–1.24)
Chloride: 109 mmol/L (ref 98–111)
GFR calc Af Amer: 45 mL/min — ABNORMAL LOW (ref >60)
GFR, EST NON AFRICAN AMERICAN: 39 mL/min — AB (ref >60)
GLUCOSE: 94 mg/dL (ref 70–99)
POTASSIUM: 4.4 mmol/L (ref 3.5–5.1)
Sodium: 141 mmol/L (ref 135–145)
Total Bilirubin: 0.5 mg/dL (ref 0.3–1.2)
Total Protein: 7 g/dL (ref 6.5–8.1)

## 2018-04-28 NOTE — Progress Notes (Signed)
[place] OFFICE PROGRESS NOTE  Date of Service:  04/28/2018   DIAGNOSIS: History of Malignant carcinoid tumor of ileum [facility])  Chief complaint: follow up prostate cancer, carcinoid tumor of ileum and anemia   PROBLEM LIST:  1. 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 The patient underwent small bowel resection on 05/19/2002.  2. Adenocarcinoma of the prostate, status post surgery on 06/30/2003.  3. Renal insufficiency.  4. Anemia of chronic disease  5. History of Crohn's disease dating back to 10/2001.  6. COPD.  7. History of kidney stones.  8. Hypertension.  9. Dyslipidemia.  10. Gout diagnosed 10/2011. Symptoms affect the left foot, being treated with Medrol.  11. History of alcohol use.  12. Elevated ferritin 1960 on 11/12/2011. Iron saturation was 41%.  CURRENT THERAPY: Surveillance  INTERVAL HISTORY:  Ronald [name] 84 y.o. 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 He was last seen by me 6 months ago. He presents tot he clinic today by himself. He is doing well. He complains of generalized body pain. He sees a doctor for his joint pain, and was told that he has "decaying bones" not arthritis. He states that he has difficulties moving his neck, and has bilateral arm and shoulder pain, worse on the left, lower back and bilateral knee pain. This body pain has been there for more than a year, and rates it 10/10 sometimes in his knees. He was not able to move his back this morning due to pain.   He states that his BP runs high sometimes, but he controls it with medications and is compliant.    MEDICAL HISTORY: Past Medical History:  Diagnosis Date  . Crohn's   . Esophageal stricture   . GERD (gastroesophageal reflux disease)   . History of alcohol abuse   . Kidney stones   . Malignant carcinoid tumor of ileum ([place])    . Prostate cancer ([place])     INTERIM HISTORY: has Malignant carcinoid tumor of ileum ([place]); ESOPHAGEAL STRICTURE; GERD; CROHN'S DISEASE; PROSTATE CANCER, HX OF; ALCOHOL ABUSE, HX OF; SMALL BOWEL OBSTRUCTION, HX OF; Abnormal liver function tests; Deficiency anemia; Elevated ferritin; Food impaction of esophagus; and Anemia of chronic disease on their problem list.    ALLERGIES:  is allergic to tape.  MEDICATIONS: has a current medication list which includes the following prescription(s): acetaminophen, allopurinol, aspirin, atorvastatin, esomeprazole, labetalol, lisinopril, mesalamine, montelukast, multiple vitamins-minerals, and triamterene-hydrochlorothiazide.  SURGICAL HISTORY:  Past Surgical History:  Procedure Laterality Date  . APPENDECTOMY    . ESOPHAGOGASTRODUODENOSCOPY N/A 08/22/2013   Procedure: ESOPHAGOGASTRODUODENOSCOPY (EGD);  Surgeon: Jerene Bears, MD;  Location: [facility];  Service: Endoscopy;  Laterality: N/A;  . LITHOTRIPSY    . PROSTATECTOMY    . SMALL INTESTINE SURGERY  05/19/02  . TONSILLECTOMY      REVIEW OF SYSTEMS:   Constitutional: Denies fevers, chills or abnormal weight loss  Eyes: Denies blurriness of vision Ears, nose, mouth, throat, and face: Denies mucositis or sore throat Respiratory: Denies cough, dyspnea or wheezes Cardiovascular: Denies palpitation, chest discomfort or lower extremity swelling Gastrointestinal:  Denies nausea, heartburn or change in bowel habits Skin: Denies abnormal skin rashes Lymphatics: Denies new lymphadenopathy or easy bruising Neurological:Denies numbness, tingling or new weaknesses MSK (+) generalized body pain, mainly in back, shoulders and knees. Behavioral/Psych: Mood is stable, no new changes  All  other systems were reviewed with the patient and are negative.  PHYSICAL EXAMINATION: ECOG PERFORMANCE STATUS: 1 - Symptomatic but completely ambulatory  Blood pressure (!) 161/74, pulse 72, temperature 98.1 F (36.7 C),  resp. rate 18, height 5' 11"  (1.803 m), weight 185 lb 4.8 oz (84.1 kg), SpO2 99 %.  GENERAL:alert, no distress and comfortable SKIN: skin color, texture, turgor are normal, no rashes or significant lesions EYES: normal, Conjunctiva are pink and non-injected, sclera clear OROPHARYNX:no exudate, no erythema and lips, buccal mucosa, and tongue normal  NECK: supple, thyroid normal size, non-tender, without nodularity LYMPH:  no palpable lymphadenopathy in the cervical, axillary or supraclavicular LUNGS: clear to auscultation and percussion with normal breathing effort HEART: regular rate & rhythm and no murmurs and no lower extremity edema ABDOMEN:abdomen soft, non-tender and normal bowel sounds Musculoskeletal:no cyanosis of digits and no clubbing  NEURO: alert & oriented x 3 with fluent speech, no focal motor/sensory deficits   LABORATORY DATA: CBC Latest Ref Rng & Units 04/28/2018 10/28/2017 02/11/2017  WBC 4.0 - 10.5 K/uL 7.0 6.6 5.6  Hemoglobin 13.0 - 17.0 g/dL 11.7(L) 13.0 12.0(L)  Hematocrit 39.0 - 52.0 % 34.0(L) 37.7(L) 34.7(L)  Platelets 150 - 400 K/uL 205 192 198    CMP Latest Ref Rng & Units 04/28/2018 10/28/2017 02/11/2017  Glucose 70 - 99 mg/dL 94 102 89  BUN 8 - 23 mg/dL 15 12 16.7  Creatinine 0.61 - 1.24 mg/dL 1.59(H) 1.36(H) 1.2  Sodium 135 - 145 mmol/L 141 140 140  Potassium 3.5 - 5.1 mmol/L 4.4 4.0 4.3  Chloride 98 - 111 mmol/L 109 109 -  CO2 22 - 32 mmol/L 26 22 25   Calcium 8.9 - 10.3 mg/dL 9.1 9.5 9.5  Total Protein 6.5 - 8.1 g/dL 7.0 7.5 7.0  Total Bilirubin 0.3 - 1.2 mg/dL 0.5 0.4 0.50  Alkaline Phos 38 - 126 U/L 59 73 68  AST 15 - 41 U/L 16 14 32  ALT 0 - 44 U/L 12 12 46    Results for Ronald [name] (MRN [id number]) as of 10/28/2017 16:05  Ref. Range 08/13/2016 12:31 10/28/2017 11:05  Iron Latest Ref Range: 42 - 163 ug/dL 69 72  UIBC Latest Units: ug/dL 164 191  TIBC Latest Ref Range: 202 - 409 ug/dL 233 263  %SAT Latest Ref Range: 20 - 55 % 30   Saturation  Ratios Latest Ref Range: 42 - 163 %  27 (L)  Ferritin Latest Ref Range: 22 - 316 ng/mL 1,588 (H) 786 (H)   Results for Ronald [name] (MRN [id number]) as of 10/28/2017 16:05  Ref. Range 08/13/2016 12:31 02/11/2017 13:42  Prostate Specific Ag, Serum Latest Ref Range: 0.0 - 4.0 ng/mL <0.1 0.2    RADIOGRAPHIC STUDIES: No results found.  ASSESSMENT: Ronald [name] 84 y.o. male with a history of   1. Prostate Cancer, diagnosed in 2004 -He is ~14 years out of his surgery. Clinically doing very well. PSA remains to be very low. There is no evidence of recurrence at this point. -Continue monitoring. -He is clinically doing well. His CBC showed Hg 11.7, CMP showed Cr 1.59. His exam was normal. No evidence of recurrence.  -I encouraged him to be active and continue to check his blood pressure at home. I advised him to see his PCP for his elevated Cr.  -f/u in 12 months  2. Carcinoid tumor, diagnosed in 2003  -He is clinically doing well, asymptomatic. No concern for recurrence  -He is 15 years out  for the carcinoid tumor, although late recurrence can happen, his risk is pretty small now  -Continue monitoring clinically  3. Anemia of chronic disease (CKD and Crohn's disease) -He has a mild anemia, normal iron and TIBC in the past, elevated ferritin. This is likely anemia of chronic disease secondary to CKD and Crohn's disease. -He has started taking iron pill a few months in early 2019.  -He takes oral iron every other day and he denies constipation. He is tolerating well, will continue -Hg at 11.7 today  4. Questionable secondary hemochromatosis.  -- He has had elevated ferritin level previously, likely secondary to his Crohn's disease.  -We checked hemochromatosis gene mutation, which showed H63D mutation heterozygous. This is unlikely hemochromatosis.  -We'll continue monitoring.  5. Crohn's disease --Management per GI   6. Recent stroke -He has recovered well, no residual neuro  deficits  Follow-up. -Lab and f/u in 12 Months -continue iron pills every other day   All questions were answered. The patient knows to call the clinic with any problems, questions or concerns. We can certainly see the patient much sooner if necessary.  I spent 15 minutes counseling the patient face to face. The total time spent in the appointment was 20 minutes.  Dierdre Searles Dweik am acting as scribe for Dr. Truitt Merle.  I have reviewed the above documentation for accuracy and completeness, and I agree with the above.    Truitt Merle, MD 04/28/2018

## 2018-04-29 ENCOUNTER — Telehealth: Payer: Self-pay | Admitting: Hematology

## 2018-04-29 ENCOUNTER — Encounter: Payer: Self-pay | Admitting: Hematology

## 2018-04-29 LAB — PROSTATE-SPECIFIC AG, SERUM (LABCORP): Prostate Specific Ag, Serum: <0.1 ng/mL (ref 0.0–4.0)

## 2018-04-29 NOTE — Telephone Encounter (Signed)
Mailed pt calendar of upcoming appts per his request.

## 2018-04-29 NOTE — Telephone Encounter (Signed)
TC from Pt. requesting lab results to be sent to [facility] [phone] Lab results faxed attention to [facility] fax number [phone].

## 2018-05-05 ENCOUNTER — Telehealth: Payer: Self-pay

## 2018-05-05 NOTE — Telephone Encounter (Signed)
-----   Message from Truitt Merle, MD sent at 05/05/2018  9:12 AM EDT ----- Please let pt know the lab results, normal PSA, mild anemia (he had before also) and slightly worse kidney function, f/u with PCP, no other concerns, thanks   Truitt Merle  05/05/2018

## 2018-05-05 NOTE — Telephone Encounter (Signed)
Left voice message for patient with lab results per Dr. Burr Medico, normal PSA, mild anemia as before, slightly worse kidney function, please f/u with your PCP, no other concerns.

## 2018-10-14 ENCOUNTER — Telehealth: Payer: Self-pay | Admitting: Gastroenterology

## 2018-10-14 NOTE — Telephone Encounter (Signed)
Pt needs rf for Nexium but states that his insurance will not cover. He uses Psychologist, clinical.

## 2018-10-14 NOTE — Telephone Encounter (Signed)
Approved.  

## 2018-10-14 NOTE — Telephone Encounter (Signed)
Prior Josem Kaufmann has been sent via cover my meds.

## 2019-04-23 ENCOUNTER — Telehealth: Payer: Self-pay | Admitting: Hematology

## 2019-04-23 NOTE — Telephone Encounter (Signed)
Returned patient's phone call regarding rescheduling 10/12 appointment, per request appointment has moved to 10/19.

## 2019-04-24 ENCOUNTER — Telehealth: Payer: Self-pay | Admitting: Hematology

## 2019-04-24 NOTE — Telephone Encounter (Signed)
Called patient to confirm cancellation of October appointments, per patient's request 10/12 has been cancelled. Patient will call back when ready to reschedule.

## 2019-04-27 ENCOUNTER — Other Ambulatory Visit: Payer: Medicare Other

## 2019-04-27 ENCOUNTER — Ambulatory Visit: Payer: Medicare Other | Admitting: Hematology

## 2019-05-04 ENCOUNTER — Other Ambulatory Visit: Payer: Medicare Other

## 2019-05-04 ENCOUNTER — Ambulatory Visit: Payer: Medicare Other | Admitting: Hematology

## 2019-10-21 ENCOUNTER — Telehealth: Payer: Self-pay | Admitting: Internal Medicine

## 2019-10-21 NOTE — Telephone Encounter (Signed)
Called patient to schedule the Palliative Consult, no answer - message left with reason for call along with my name and contact number

## 2019-10-22 ENCOUNTER — Telehealth: Payer: Self-pay | Admitting: Internal Medicine

## 2019-10-22 MED ORDER — CALCIUM CARBONATE 1250 (500 CA) MG PO TABS
1250.00 | ORAL_TABLET | ORAL | Status: DC
Start: 2019-10-21 — End: 2019-10-22

## 2019-10-22 MED ORDER — ALPRAZOLAM 0.25 MG PO TABS
0.25 | ORAL_TABLET | ORAL | Status: DC
Start: ? — End: 2019-10-22

## 2019-10-22 MED ORDER — GENERIC EXTERNAL MEDICATION
5.00 | Status: DC
Start: ? — End: 2019-10-22

## 2019-10-22 MED ORDER — NYSTATIN 100000 UNIT/GM EX POWD
CUTANEOUS | Status: DC
Start: 2019-10-21 — End: 2019-10-22

## 2019-10-22 MED ORDER — TAMSULOSIN HCL 0.4 MG PO CAPS
0.40 | ORAL_CAPSULE | ORAL | Status: DC
Start: 2019-10-22 — End: 2019-10-22

## 2019-10-22 MED ORDER — ALLOPURINOL 100 MG PO TABS
50.00 | ORAL_TABLET | ORAL | Status: DC
Start: 2019-10-22 — End: 2019-10-22

## 2019-10-22 MED ORDER — SODIUM CHLORIDE FLUSH 0.9 % IV SOLN
10.00 | INTRAVENOUS | Status: DC
Start: ? — End: 2019-10-22

## 2019-10-22 MED ORDER — SORBITOL 70 % PO SOLN
30.00 | ORAL | Status: DC
Start: ? — End: 2019-10-22

## 2019-10-22 MED ORDER — CHOLECALCIFEROL 25 MCG (1000 UT) PO TABS
5000.00 | ORAL_TABLET | ORAL | Status: DC
Start: 2019-10-22 — End: 2019-10-22

## 2019-10-22 MED ORDER — ACETAMINOPHEN 325 MG PO TABS
650.00 | ORAL_TABLET | ORAL | Status: DC
Start: ? — End: 2019-10-22

## 2019-10-22 MED ORDER — BUSPIRONE HCL 5 MG PO TABS
2.50 | ORAL_TABLET | ORAL | Status: DC
Start: 2019-10-21 — End: 2019-10-22

## 2019-10-22 MED ORDER — DILTIAZEM HCL ER BEADS 120 MG PO CP24
240.00 | ORAL_CAPSULE | ORAL | Status: DC
Start: 2019-10-22 — End: 2019-10-22

## 2019-10-22 MED ORDER — POLYETHYLENE GLYCOL 3350 17 GM/SCOOP PO POWD
17.00 | ORAL | Status: DC
Start: ? — End: 2019-10-22

## 2019-10-22 MED ORDER — SENNOSIDES-DOCUSATE SODIUM 8.6-50 MG PO TABS
1.00 | ORAL_TABLET | ORAL | Status: DC
Start: ? — End: 2019-10-22

## 2019-10-22 MED ORDER — APIXABAN 2.5 MG PO TABS
2.50 | ORAL_TABLET | ORAL | Status: DC
Start: 2019-10-21 — End: 2019-10-22

## 2019-10-22 MED ORDER — EPOETIN ALFA 20000 UNIT/ML IJ SOLN
20000.00 | INTRAMUSCULAR | Status: DC
Start: 2019-10-23 — End: 2019-10-22

## 2019-10-22 MED ORDER — HYDROCODONE-ACETAMINOPHEN 5-325 MG PO TABS
0.50 | ORAL_TABLET | ORAL | Status: DC
Start: ? — End: 2019-10-22

## 2019-10-22 MED ORDER — DICLOFENAC SODIUM 1 % EX GEL
2.00 | CUTANEOUS | Status: DC
Start: ? — End: 2019-10-22

## 2019-10-22 MED ORDER — CYCLOBENZAPRINE HCL 10 MG PO TABS
5.00 | ORAL_TABLET | ORAL | Status: DC
Start: ? — End: 2019-10-22

## 2019-10-22 MED ORDER — HYDRALAZINE HCL 25 MG PO TABS
25.00 | ORAL_TABLET | ORAL | Status: DC
Start: 2019-10-21 — End: 2019-10-22

## 2019-10-22 MED ORDER — ONDANSETRON 4 MG PO TBDP
4.00 | ORAL_TABLET | ORAL | Status: DC
Start: ? — End: 2019-10-22

## 2019-10-22 MED ORDER — TORSEMIDE 10 MG PO TABS
40.00 | ORAL_TABLET | ORAL | Status: DC
Start: 2019-10-22 — End: 2019-10-22

## 2019-10-22 MED ORDER — BISACODYL 10 MG RE SUPP
10.00 | RECTAL | Status: DC
Start: ? — End: 2019-10-22

## 2019-10-22 MED ORDER — POLYSACCHARIDE IRON COMPLEX 150 MG PO CAPS
150.00 | ORAL_CAPSULE | ORAL | Status: DC
Start: 2019-10-22 — End: 2019-10-22

## 2019-10-22 MED ORDER — FAMOTIDINE 20 MG PO TABS
20.00 | ORAL_TABLET | ORAL | Status: DC
Start: 2019-10-21 — End: 2019-10-22

## 2019-10-22 NOTE — Telephone Encounter (Signed)
Spoke with patient's daughter Ronald [name] regarding Palliative services and she was in agreement with this.  I have scheduled an In-person Consult for 10/28/19 @ 8:30 AM

## 2019-10-28 ENCOUNTER — Other Ambulatory Visit: Payer: Medicare Other | Admitting: Internal Medicine

## 2019-10-28 ENCOUNTER — Other Ambulatory Visit: Payer: Self-pay
